# Patient Record
Sex: Male | Born: 1950 | Race: Black or African American | Hispanic: No | Marital: Single | Smoking: Former smoker
Health system: Southern US, Community
[De-identification: ages and names within clinical notes are randomized; demographics above are authoritative.]

## PROBLEM LIST (undated history)

## (undated) DIAGNOSIS — R030 Elevated blood-pressure reading, without diagnosis of hypertension: Secondary | ICD-10-CM

## (undated) DIAGNOSIS — R009 Unspecified abnormalities of heart beat: Secondary | ICD-10-CM

## (undated) DIAGNOSIS — Z87438 Personal history of other diseases of male genital organs: Secondary | ICD-10-CM

## (undated) HISTORY — PX: MANDIBLE FRACTURE SURGERY: SHX706

## (undated) HISTORY — DX: Elevated blood-pressure reading, without diagnosis of hypertension: R00.9

## (undated) HISTORY — DX: Elevated blood-pressure reading, without diagnosis of hypertension: R03.0

## (undated) HISTORY — DX: Personal history of other diseases of male genital organs: Z87.438

---

## 1999-03-05 ENCOUNTER — Emergency Department (HOSPITAL_COMMUNITY): Admission: EM | Admit: 1999-03-05 | Discharge: 1999-03-05 | Payer: Self-pay | Admitting: Emergency Medicine

## 1999-11-05 ENCOUNTER — Encounter: Payer: Self-pay | Admitting: Cardiology

## 1999-11-05 ENCOUNTER — Encounter: Admission: RE | Admit: 1999-11-05 | Discharge: 1999-11-05 | Payer: Self-pay | Admitting: Cardiology

## 1999-11-10 ENCOUNTER — Encounter: Admission: RE | Admit: 1999-11-10 | Discharge: 1999-11-10 | Payer: Self-pay | Admitting: Cardiology

## 1999-11-10 ENCOUNTER — Encounter: Payer: Self-pay | Admitting: Cardiology

## 2001-01-27 ENCOUNTER — Ambulatory Visit (HOSPITAL_COMMUNITY): Admission: RE | Admit: 2001-01-27 | Discharge: 2001-01-27 | Payer: Self-pay | Admitting: *Deleted

## 2006-02-14 ENCOUNTER — Encounter: Admission: RE | Admit: 2006-02-14 | Discharge: 2006-02-14 | Payer: Self-pay | Admitting: Cardiology

## 2008-10-21 ENCOUNTER — Encounter: Admission: RE | Admit: 2008-10-21 | Discharge: 2008-10-21 | Payer: Self-pay | Admitting: Cardiology

## 2008-10-21 IMAGING — CT CT HEAD W/O CM
2 series · 16 of 30 positions shown, 20 images · non-contrast
Comparison: None

CLINICAL DATA: Headache, neck stiffness

CT HEAD WITHOUT CONTRAST
TECHNIQUE: Contiguous axial images were obtained from the base of
the skull through the vertex without contrast.

[Series 2: head w/o · axial · non-contrast · 0.45mm/px · z∈[+35,+146]mm · 13 of 32 slices shown, 17 images]
[im 3/32  brain]
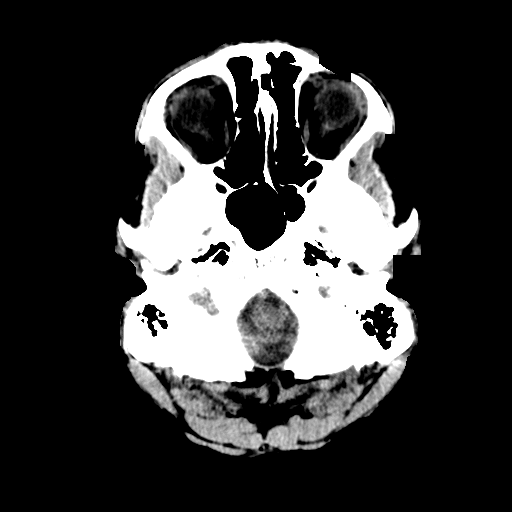
[im 3/32  bone]
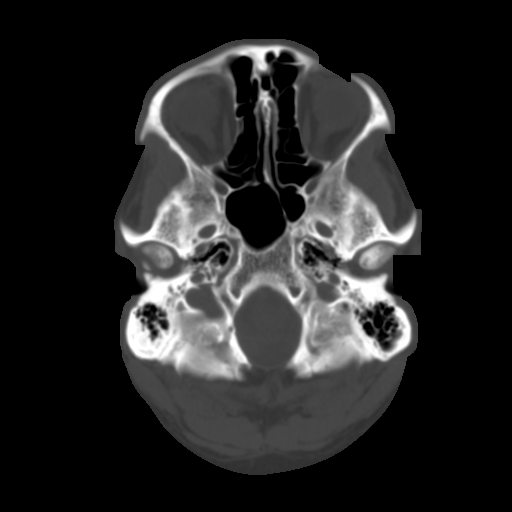
[im 5/32  brain]
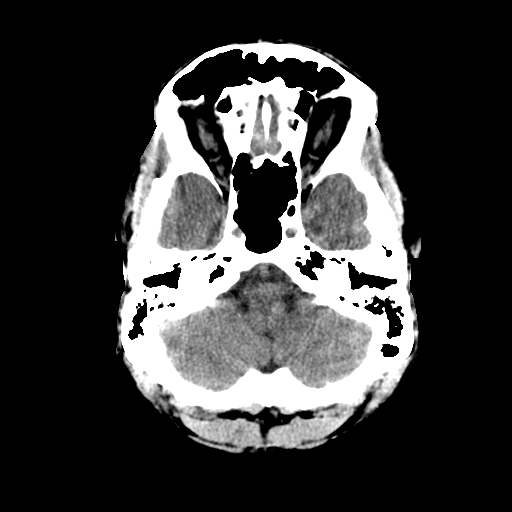
[im 7/32  brain]
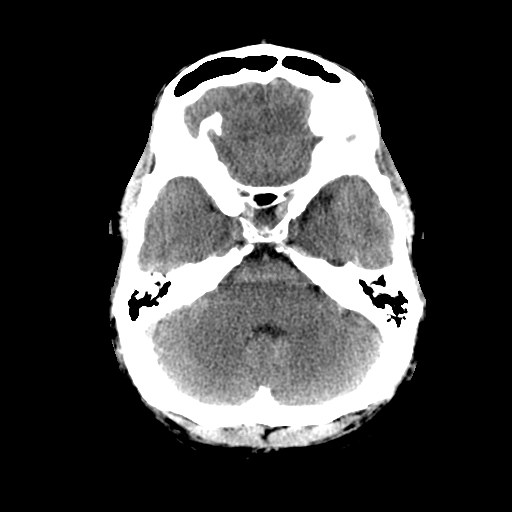
[im 9/32  brain]
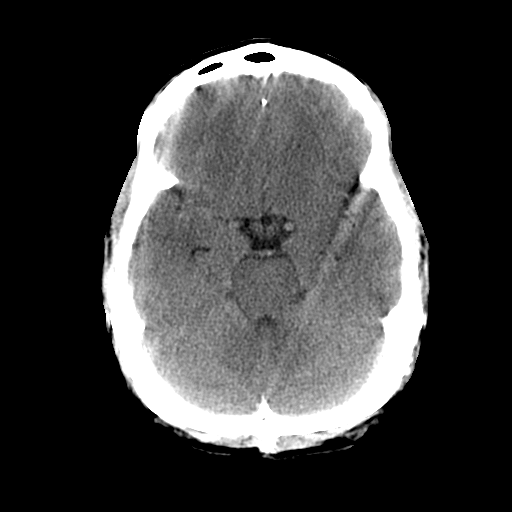
[im 12/32  brain]
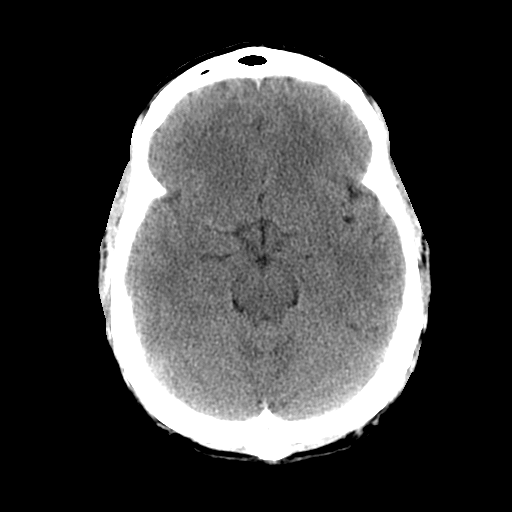
[im 12/32  bone]
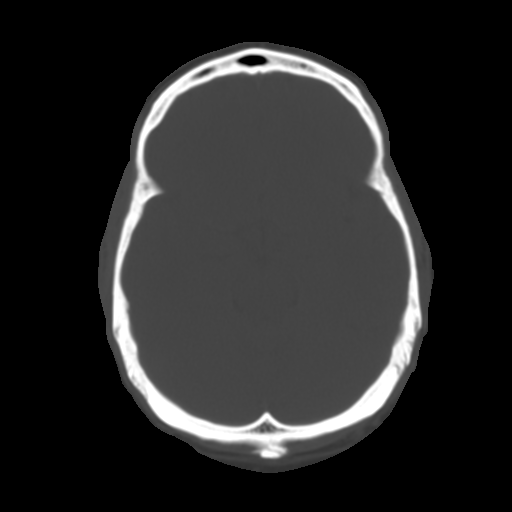
[im 14/32  brain]
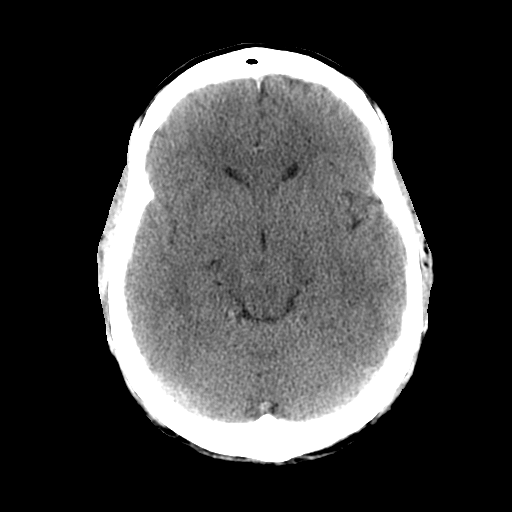
[im 16/32  brain]
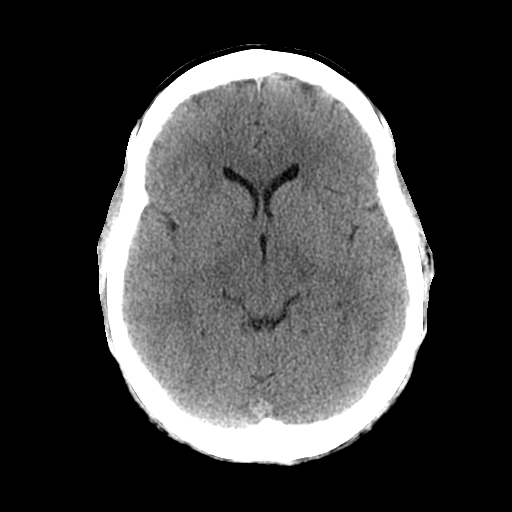
[im 18/32  brain]
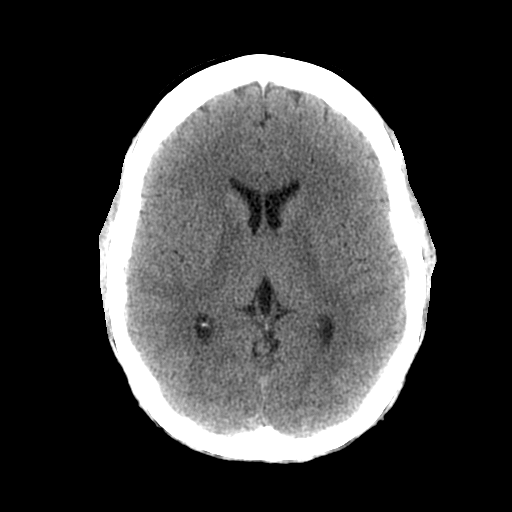
[im 20/32  brain]
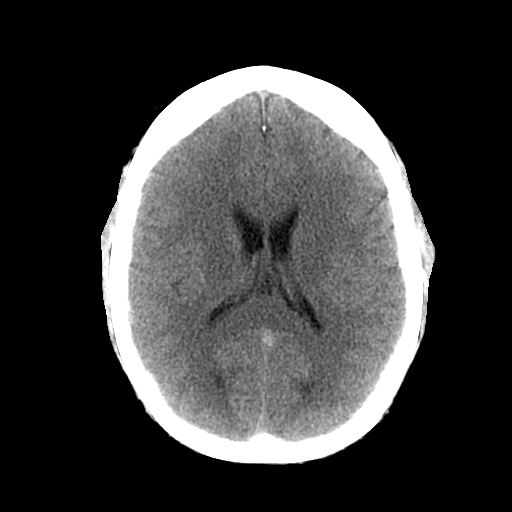
[im 20/32  bone]
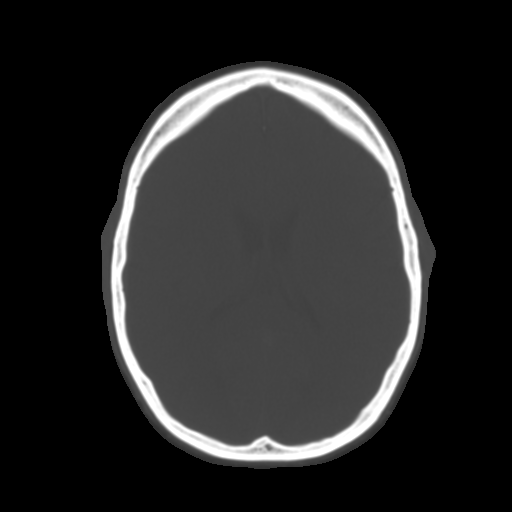
[im 23/32  brain]
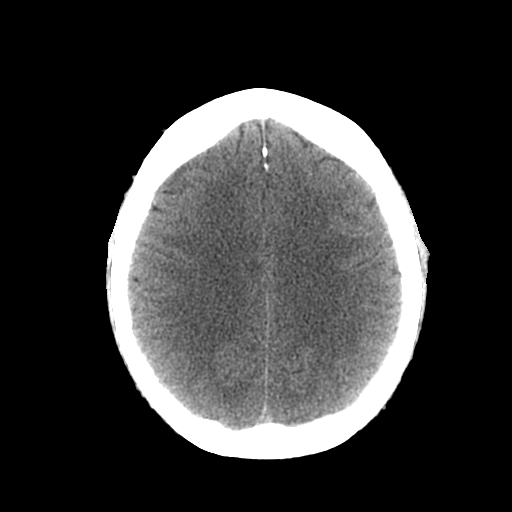
[im 25/32  brain]
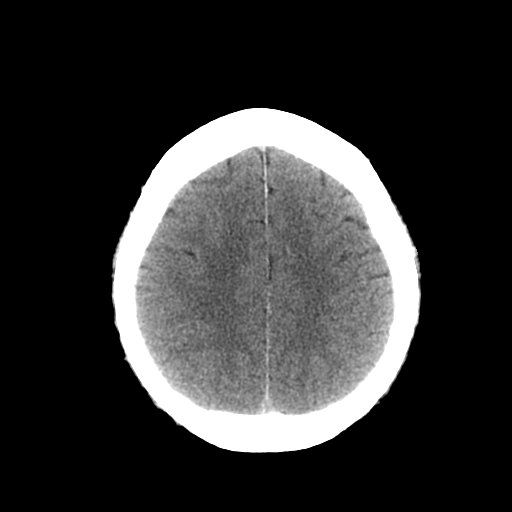
[im 27/32  brain]
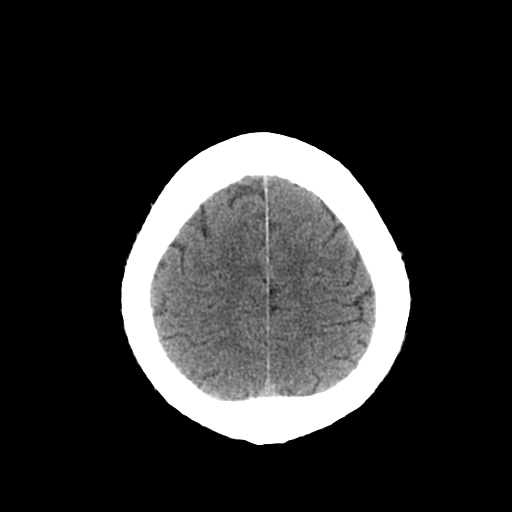
[im 29/32  brain]
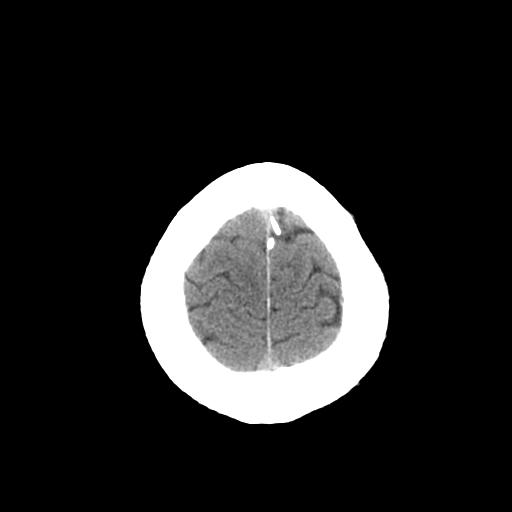
[im 29/32  bone]
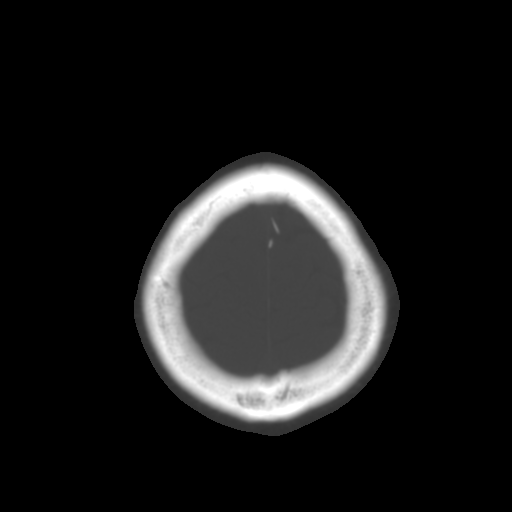

[Series 3: bone windows · axial · 0.45mm/px · z∈[+35,+70]mm · 3 of 32 slices shown]
[im 3/32  bone]
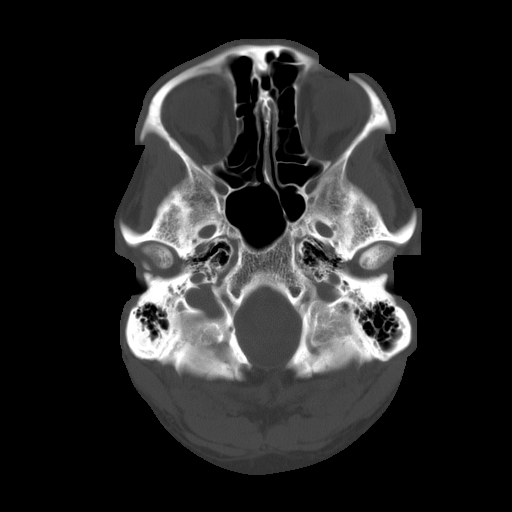
[im 7/32  bone]
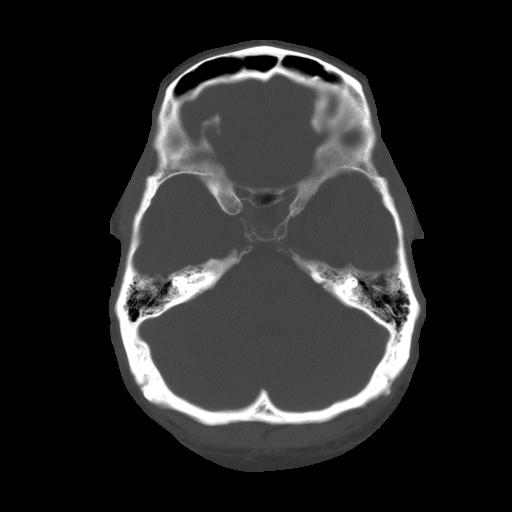
[im 12/32  bone]
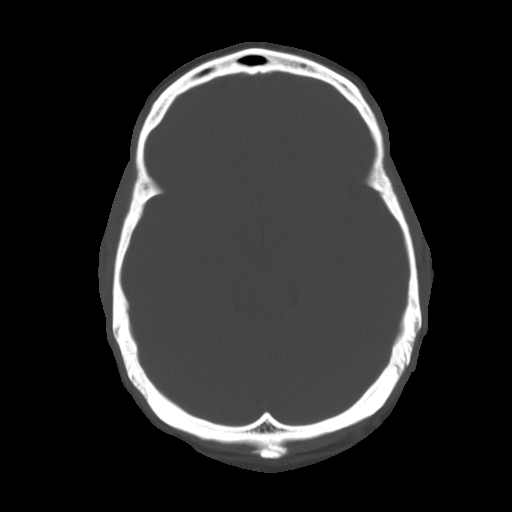

[16 of 30 positions shown; findings below may reference images not displayed]

FINDINGS: No acute hemorrhage, acute infarction, or mass lesion is
identified.  No midline shift.  No ventriculomegaly.  Orbits and
paranasal sinuses are intact.  No skull fracture.
IMPRESSION: No acute intracranial finding.

## 2010-01-09 ENCOUNTER — Emergency Department (HOSPITAL_COMMUNITY): Admission: EM | Admit: 2010-01-09 | Discharge: 2010-01-09 | Payer: Self-pay | Admitting: Emergency Medicine

## 2010-01-10 ENCOUNTER — Emergency Department (HOSPITAL_COMMUNITY): Admission: EM | Admit: 2010-01-10 | Discharge: 2010-01-10 | Payer: Self-pay | Admitting: Emergency Medicine

## 2010-07-31 LAB — POCT I-STAT, CHEM 8
BUN: 17 mg/dL (ref 6–23)
Calcium, Ion: 1.14 mmol/L (ref 1.12–1.32)
Chloride: 107 mEq/L (ref 96–112)
Creatinine, Ser: 1.2 mg/dL (ref 0.4–1.5)
Glucose, Bld: 81 mg/dL (ref 70–99)
HCT: 50 % (ref 39.0–52.0)
Hemoglobin: 17 g/dL (ref 13.0–17.0)
Potassium: 4 mEq/L (ref 3.5–5.1)
Sodium: 141 mEq/L (ref 135–145)
TCO2: 27 mmol/L (ref 0–100)

## 2011-06-21 ENCOUNTER — Other Ambulatory Visit: Payer: Self-pay | Admitting: Cardiology

## 2011-06-21 ENCOUNTER — Ambulatory Visit
Admission: RE | Admit: 2011-06-21 | Discharge: 2011-06-21 | Disposition: A | Discharge status: Home / Self Care | Payer: BC Managed Care – PPO | Source: Ambulatory Visit | Attending: Cardiology | Admitting: Cardiology

## 2011-06-21 DIAGNOSIS — R52 Pain, unspecified: Secondary | ICD-10-CM

## 2011-07-12 ENCOUNTER — Ambulatory Visit (HOSPITAL_BASED_OUTPATIENT_CLINIC_OR_DEPARTMENT_OTHER): Payer: BC Managed Care – PPO

## 2011-12-13 ENCOUNTER — Ambulatory Visit
Admission: RE | Admit: 2011-12-13 | Discharge: 2011-12-13 | Disposition: A | Discharge status: Home / Self Care | Payer: BC Managed Care – PPO | Source: Ambulatory Visit | Attending: Cardiology | Admitting: Cardiology

## 2011-12-13 ENCOUNTER — Other Ambulatory Visit: Payer: Self-pay | Admitting: Cardiology

## 2011-12-13 DIAGNOSIS — M25512 Pain in left shoulder: Secondary | ICD-10-CM

## 2012-07-07 ENCOUNTER — Emergency Department (HOSPITAL_COMMUNITY)
Admission: EM | Admit: 2012-07-07 | Discharge: 2012-07-07 | Disposition: A | Discharge status: Home / Self Care | Payer: BC Managed Care – PPO | Source: Home / Self Care | Attending: Emergency Medicine | Admitting: Emergency Medicine

## 2012-07-07 ENCOUNTER — Encounter (HOSPITAL_COMMUNITY): Payer: Self-pay | Admitting: Emergency Medicine

## 2012-07-07 MED ORDER — PREDNISONE 5 MG PO KIT: 1.0000 | PACK | Freq: Every day | ORAL | Status: DC

## 2012-07-07 MED ORDER — PERMETHRIN 5 % EX CREA: TOPICAL_CREAM | CUTANEOUS | Status: DC

## 2012-07-07 MED ORDER — HYDROXYZINE HCL 25 MG PO TABS: 25.0000 mg | ORAL_TABLET | Freq: Four times a day (QID) | ORAL | Status: DC

## 2012-07-07 NOTE — ED Provider Notes (Signed)
Chief Complaint  Patient presents with  . Rash    rash on hands arms stomach and back x 2 wks.     History of Present Illness:   Seth Cox is a 62 year old male who has had a two-week history of a pruritic rash on his hands, arms, chest, and back. It's worse at nighttime. He has not been around anyone else who has had a similar rash and he lives by himself. He cannot think of anything that he might have come in contact with including soaps, body wash, washing powders, detergents, dryer sheet, or fabric softener. No exposure to plants, animals, chemicals at home, chemicals at work, or cosmetics. No new foods or medications. He denies any difficulty breathing or wheezing. He does have some chapped lips and has been using Carmex, but doesn't think that this was the cause of it since the rash broke out before he started using the Carmex.  Review of Systems:  Other than noted above, the patient denies any of the following symptoms: Systemic:  No fever, chills, sweats, weight loss, or fatigue. ENT:  No nasal congestion, rhinorrhea, sore throat, swelling of lips, tongue or throat. Resp:  No cough, wheezing, or shortness of breath. Skin:  No rash, itching, nodules, or suspicious lesions.  PMFSH:  Past medical history, family history, social history, meds, and allergies were reviewed.  Physical Exam:   Vital signs:  BP 141/75  Pulse 60  Temp(Src) 98.1 F (36.7 C) (Oral)  Resp 18  SpO2 100% Gen:  Alert, oriented, in no distress. ENT:  Pharynx clear, no intraoral lesions, moist mucous membranes. Lungs:  Clear to auscultation. Skin:  He has a maculopapular rash on the web spaces between the fingers, a few lesions on the wrist, upper and lower arms, chest, and back.  Assessment:  The encounter diagnosis was Scabies.  Differential diagnosis includes scabies, contact dermatitis, or dyshidrotic eczema.  Plan:   1.  The following meds were prescribed:   New Prescriptions   HYDROXYZINE  (ATARAX/VISTARIL) 25 MG TABLET    Take 1 tablet (25 mg total) by mouth every 6 (six) hours.   PERMETHRIN (ELIMITE) 5 % CREAM    Apply head to toe at bedtime.  Leave on for 8 hours.  Scrub off next morning.  Repeat procedure in 1 week.   PREDNISONE 5 MG KIT    Take 1 kit (5 mg total) by mouth daily after breakfast. Prednisone 5 mg 6 day dosepack.  Take as directed.   2.  The patient was instructed in symptomatic care and handouts were given. 3.  The patient was told to return if becoming worse in any way, if no better in 3 or 4 days, and given some red flag symptoms that would indicate earlier return.     Reuben Likes, MD 07/07/12 [phone]

## 2012-07-07 NOTE — ED Notes (Signed)
Pt c/o rash on hands, stomach, arms and back x 2 wks and is gradually getting worse. Pt denies changes in soaps or detergents. Pt has not tried otc meds for treatment,.

## 2012-08-27 ENCOUNTER — Encounter (HOSPITAL_COMMUNITY): Payer: Self-pay | Admitting: *Deleted

## 2012-08-27 ENCOUNTER — Emergency Department (HOSPITAL_COMMUNITY)
Admission: EM | Admit: 2012-08-27 | Discharge: 2012-08-27 | Disposition: A | Discharge status: Home / Self Care | Payer: BC Managed Care – PPO | Source: Home / Self Care | Attending: Emergency Medicine | Admitting: Emergency Medicine

## 2012-08-27 DIAGNOSIS — R21 Rash and other nonspecific skin eruption: Secondary | ICD-10-CM

## 2012-08-27 DIAGNOSIS — L819 Disorder of pigmentation, unspecified: Secondary | ICD-10-CM

## 2012-08-27 MED ORDER — AQUAPHOR EX OINT: TOPICAL_OINTMENT | CUTANEOUS | Status: DC | PRN
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
## 2012-08-27 NOTE — ED Notes (Signed)
Patient left before discharge papers were signed and given. Was informed by front desk that patient was very upset.
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
## 2014-12-11 ENCOUNTER — Ambulatory Visit: Payer: 59 | Attending: Orthopaedic Surgery | Admitting: Physical Therapy

## 2014-12-11 DIAGNOSIS — R293 Abnormal posture: Secondary | ICD-10-CM

## 2014-12-11 DIAGNOSIS — M545 Low back pain, unspecified: Secondary | ICD-10-CM

## 2014-12-11 DIAGNOSIS — M62830 Muscle spasm of back: Secondary | ICD-10-CM | POA: Insufficient documentation

## 2014-12-11 DIAGNOSIS — M542 Cervicalgia: Secondary | ICD-10-CM | POA: Insufficient documentation

## 2014-12-11 NOTE — Patient Instructions (Signed)
   Layali Freund PT, DPT, LAT, ATC  [facility] Phone: [phone]     

## 2014-12-11 NOTE — Therapy (Addendum)
[place], [place], [address] Phone: [phone]   Fax:  [phone]  Physical Therapy Evaluation  Patient Details  Name: Seth Cox MRN: [id number] Date of Birth: [birth year] Referring Provider:  Garald Balding, MD  Encounter Date: 12/11/2014      PT End of Session - 12/11/14 1201    Visit Number 1   Number of Visits 1   Date for PT Re-Evaluation 12/12/14   PT Start Time 1100   PT Stop Time 1145   PT Time Calculation (min) 45 min   Activity Tolerance Patient tolerated treatment well   Behavior During Therapy [facility] for tasks assessed/performed      No past medical history on file.  No past surgical history on file.  There were no vitals filed for this visit.  Visit Diagnosis:  Left-sided low back pain without sciatica - Plan: PT plan of care cert/re-cert  Neck pain - Plan: PT plan of care cert/re-cert  Muscle spasm of back - Plan: PT plan of care cert/re-cert  Abnormal posture - Plan: PT plan of care cert/re-cert      Subjective Assessment - 12/11/14 1108    Subjective pt is a 64 y.o M with CC of neck with referral to the left shoulder and low back pain with referral down to the mid lateral L shin  that started In January that started insidiously that has progressively gotten worse since onset.    Limitations House hold activities;Walking;Standing   How long can you sit comfortably? unlimited   How long can you stand comfortably? unlimited   How long can you walk comfortably? 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 Yes   Pain Score 2    Pain Location Back   Pain Orientation Left   Pain Descriptors / Indicators Shooting   Pain Type Chronic pain   Pain Radiating Towards down to the L lateral shin   Pain Frequency Intermittent   Aggravating Factors  standing after prolonged  sitting,    Pain Relieving Factors walking, movement   Multiple Pain Sites Yes   Pain Score 1   Pain Location Neck   Pain Orientation Left   Pain Descriptors / Indicators Tightness;Shooting   Pain Type Chronic pain   Pain Radiating Towards to the left shoulder   Pain Onset More than a month ago   Pain Frequency Intermittent   Aggravating Factors  laying on it,    Pain Relieving Factors getting off of the shoulder            [facility] PT Assessment - 12/11/14 1109    Assessment   Medical Diagnosis neck and low back apin   Onset Date/Surgical Date --  january of 2016   Hand Dominance Right   Next MD Visit PRN   Prior Therapy Yes   Precautions   Precautions None   Restrictions   Weight Bearing Restrictions No   Balance Screen   Has the patient fallen in the past 6 months No   Has the patient had a decrease in activity level because of a fear of falling?  No   Is the patient reluctant to leave their home because of a fear of falling?  No   Home Environment   Living Environment Private residence   Living Arrangements Alone   Type  of [place] to enter   Entrance Stairs-Number of Steps 3   Entrance Stairs-Rails None   Home Layout One level   Home Equipment None   Prior Function   Level of Independence Independent   Vocation Full time employment  truck driver    Vocation Requirements prolonged sitting, lifting, carrying, lifting,   Leisure golf   Cognition   Overall Cognitive Status Within Functional Limits for tasks assessed   Observation/Other Assessments   Focus on Therapeutic Outcomes (FOTO)  24% limitation   predicted 24%    Posture/Postural Control   Posture/Postural Control Postural limitations   Postural Limitations Decreased lumbar lordosis;Forward head;Rounded Shoulders;Posterior pelvic tilt   ROM / Strength   AROM / PROM / Strength AROM;Strength   AROM   AROM Assessment Site Cervical;Lumbar   Cervical Flexion 42   Cervical Extension  55   Cervical - Right Side Bend 38   Cervical - Left Side Bend 28  pain at the end range and referral to the shoulder   Cervical - Right Rotation 45   Cervical - Left Rotation 45   Lumbar Flexion 82   Lumbar Extension 18   Lumbar - Right Side Bend 55   Lumbar - Left Side Bend 55   Lumbar - Right Rotation 75%   Lumbar - Left Rotation 75%   Strength   Strength Assessment Site Hip;Knee   Right/Left Hip Right;Left   Right Hip Flexion 5/5   Right Hip Extension 5/5   Right Hip ABduction 4+/5   Right Hip ADduction 4/5   Left Hip Flexion 4+/5   Left Hip Extension 4/5   Left Hip ABduction 4/5   Left Hip ADduction 4/5   Right/Left Knee Right;Left   Right Knee Flexion 5/5   Right Knee Extension 5/5   Left Knee Flexion 5/5   Left Knee Extension 5/5   Palpation   Spinal mobility tenderness noted at L1-L2 with burning sensation upon palpation, with tenderness at the piriformis.   cervical spine tenderness at L upper trap/rhomboids   Special Tests    Special Tests Lumbar;Cervical   Cervical Tests Spurling's;other   Lumbar Tests Straight Leg Raise;Prone Knee Bend Test   Spurling's   Findings Negative   other    Findings Negative   Comment ULLT   Prone Knee Bend Test   Findings Negative   Side --  bil   Straight Leg Raise   Findings Negative   Side  --  bil   Ambulation/Gait   Gait Pattern Step-through pattern;Within Functional Limits                           PT Education - 12/11/14 1200    Education provided Yes   Education Details evaluation findings, POC, goals, HEP   Person(s) Educated Patient   Methods Explanation   Comprehension Verbalized understanding                    Plan - 12/11/14 1201    Clinical Impression Statement Seth Cox presents to OPPT with CC of neck pain with referral of symptoms to the left shoulder and low back pain with referral of symptoms to the left hip and intemrittent referral to the L lateral shin. he  demontrates functional trunk mobility with pain during flexion in his hamstrings due to tightness. MMT revealed mild weakness in the L hip musculature compared bil. plapation revealed hypomobility and tenderness  at L1-L2  with no referral of symptoms down the legs. palaption at the neck revealed tendernes of the Left upper trap and rhomboid musculature. sitting he demosntrated forward head posture, anteriorly rolled shoulders and decresaed lumbar lordosis. pt reported that he would only like to be seen for this visit only and return back to the doctor after he has done his exercises and see what progress he has made.    Pt will benefit from skilled therapeutic intervention in order to improve on the following deficits Pain;Hypomobility;Decreased activity tolerance;Decreased endurance;Increased muscle spasms   Rehab Potential Good   PT Frequency --  1 visit only   PT Treatment/Interventions ADLs/Self Care Home Management;Cryotherapy;Electrical Stimulation;Moist Heat;Ultrasound;Therapeutic activities;Therapeutic exercise;Neuromuscular re-education;Patient/family education;Balance training;Dry needling;Taping   PT Next Visit Plan pt opted for only evaluation visit   PT Home Exercise Plan see HEP handout   Consulted and Agree with Plan of Care Patient         Problem List There are no active problems to display for this patient.  Starr Lake PT, DPT, LAT, ATC  12/11/2014  12:29 PM      [facility] [address] [place], [place], [address] Phone: [phone]   Fax:  [phone]        PHYSICAL THERAPY DISCHARGE SUMMARY  Visits from Start of Care: 1  Current functional level related to goals / functional outcomes: FOTO 24% limited   Remaining deficits: N/A   Education / Equipment: HEP  Plan: Patient agrees to discharge.  Patient goals were not met. Patient is being discharged due to                                                      ????? 1 x visit only          Jyrah Blye PT, DPT, LAT, ATC  03/03/2015  8:40 AM

## 2017-07-07 ENCOUNTER — Telehealth: Payer: Self-pay | Admitting: Cardiology

## 2017-07-07 NOTE — Telephone Encounter (Signed)
Received incoming records from [facility] for upcoming appointment on 07/11/17 @ 8:20 am with Dr. Herbie BaltimoreHarding. Records located in Medical Records. 07/07/17 ab

## 2017-07-08 DIAGNOSIS — Z87438 Personal history of other diseases of male genital organs: Secondary | ICD-10-CM | POA: Insufficient documentation

## 2017-07-11 ENCOUNTER — Ambulatory Visit (INDEPENDENT_AMBULATORY_CARE_PROVIDER_SITE_OTHER): Payer: 59 | Admitting: Cardiology

## 2017-07-11 ENCOUNTER — Encounter: Payer: Self-pay | Admitting: Cardiology

## 2017-07-11 VITALS — BP 134/72 | HR 49 | Ht 72.0 in | Wt 210.4 lb

## 2017-07-11 DIAGNOSIS — R001 Bradycardia, unspecified: Secondary | ICD-10-CM | POA: Insufficient documentation

## 2017-07-11 DIAGNOSIS — R079 Chest pain, unspecified: Secondary | ICD-10-CM | POA: Diagnosis not present

## 2017-07-11 DIAGNOSIS — R009 Unspecified abnormalities of heart beat: Secondary | ICD-10-CM | POA: Diagnosis not present

## 2017-07-11 DIAGNOSIS — R030 Elevated blood-pressure reading, without diagnosis of hypertension: Secondary | ICD-10-CM | POA: Diagnosis not present

## 2017-07-11 NOTE — Assessment & Plan Note (Signed)
Blood pressures well controlled today.  Not currently on medications.  Obviously cannot use beta-blocker.

## 2017-07-11 NOTE — Progress Notes (Signed)
PCP: Patient, No Pcp Per  Clinic Note: Chief Complaint  Patient presents with  . New pt eval    Referred by Dr. Nehemiah SettlePolite for Atypical CP  pt c/o occasional sharp chest pain accompanied by occasional SOB; dizziness--when changing positions; no other Sx.    HPI: Seth Cox is a 67 y.o. male who is being seen today for the evaluation of Atypical Chest Pain at the request of Renford DillsPolite, Ronald, MD. His only with the past medical history is BPH as well as elevated blood pressure without diagnosis of hypertension.  He is a 1 pack/day smoker-->   Quit Sept 2018 with the aid of Zyban.  He also does drink some alcohol but not to excess.  Seth Cox was seen on June 30, 2017 chest pain by Dr. Nehemiah SettlePolite c/o chest pain.  Was referred for Cardiology findings.  Recent Hospitalizations: None  Studies Personally Reviewed - (if available, images/films reviewed: From Epic Chart or Care Everywhere)  None  Interval History: Seth Cox presents here today with complaints of intermittent episodes of central/substernal chest discomfort that happens off and on not necessarily associated with any particular activity.  It is described as a sharp pressure-like discomfort and episodes may last anywhere from 2-5 minutes.  Sometimes he feels a little lightheaded and heart skipping when these episodes occur, and often they are associated with dyspnea.  The discomfort can occur both at rest and with exertion, and oftentimes occurs when he lies down at night.  Remainder of his cardiac review of symptoms is relatively normal: No PND, orthopnea or edema.  No weakness or syncope/near syncope, or TIA/amaurosis fugax symptoms. No melena, hematochezia, hematuria, or epstaxis. No claudication.  ROS: A comprehensive was performed. Review of Systems  Constitutional: Positive for malaise/fatigue (He does usually get tired about midway through the day.).  HENT: Negative for congestion and nosebleeds.   Respiratory:  Positive for shortness of breath (Off and on, often associated with the worst part of chest pain episodes).   Cardiovascular: Negative for claudication.  Gastrointestinal: Negative for blood in stool, heartburn and melena.  Genitourinary: Negative for hematuria.  Musculoskeletal: Positive for joint pain (Mild arthritis pains.).  Neurological: Positive for dizziness.       Lightheadedness with mild dizziness occasionally.  Usually associated with chest discomfort  Psychiatric/Behavioral: Negative for memory loss. The patient is not nervous/anxious and does not have insomnia.   All other systems reviewed and are negative.  PAD Screen 07/11/2017  Previous PAD dx? No  Previous surgical procedure? No  Pain with walking? No  Feet/toe relief with dangling? No  Painful, non-healing ulcers? No  Extremities discolored? No   I have reviewed and (if needed) personally updated the patient's problem list, medications, allergies, past medical and surgical history, social and family history.   Past Medical History:  Diagnosis Date  . Elevated heart rate with elevated blood pressure without diagnosis of hypertension   . History of BPH     Past Surgical History:  Procedure Laterality Date  . MANDIBLE FRACTURE SURGERY      Current Meds  Medication Sig  . celecoxib (CELEBREX) 200 MG capsule Take 1 capsule by mouth daily.  Marland Kitchen. oxybutynin (DITROPAN-XL) 10 MG 24 hr tablet Take 10 mg by mouth daily.  . tamsulosin (FLOMAX) 0.4 MG CAPS capsule Take 1 capsule by mouth at bedtime.    Allergies  Allergen Reactions  . Ibuprofen     Social History   Tobacco Use  . Smoking status: Former  Smoker    Packs/day: 1.00    Types: Cigarettes  . Smokeless tobacco: Never Used  Substance Use Topics  . Alcohol use: Yes    Comment: occasional  . Drug use: No   Social History   Social History Narrative   He is currently single, but clearly has a significant other who is with him today.  He lives alone.  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   He quit smoking in September 2018.  He may be has 4 drinks of vodka in the course of the week.  He does not drink when he is on the road driving.    family history includes Fibromyalgia in his mother; Healthy in his sister; Heart failure in his sister; Hypertension in his brother.  Wt Readings from Last 3 Encounters:  07/11/17 210 lb 6.4 oz (95.4 kg)    PHYSICAL EXAM BP 134/72   Pulse (!) 49   Ht 6' (1.829 m)   Wt 210 lb 6.4 oz (95.4 kg)   BMI 28.54 kg/m  Physical Exam  Constitutional: He is oriented to person, place, and time. He appears well-developed and well-nourished. No distress.  HENT:  Head: Normocephalic and atraumatic.  Mouth/Throat: No oropharyngeal exudate.  Eyes: Conjunctivae and EOM are normal. Pupils are equal, round, and reactive to light. No scleral icterus.  Neck: Normal range of motion. Neck supple. No hepatojugular reflux and no JVD present. Carotid bruit is not present. No thyromegaly present.  Cardiovascular: Regular rhythm and intact distal pulses.  No extrasystoles are present. Bradycardia present. PMI is not displaced. Exam reveals no gallop and no friction rub.  No murmur heard. Pulmonary/Chest: Effort normal and breath sounds normal. No respiratory distress. He has no wheezes. He exhibits no tenderness (Not able to reproduce pain on palpation.).  Abdominal: Soft. Bowel sounds are normal. He exhibits no distension. There is no tenderness. There is no rebound.  Musculoskeletal: Normal range of motion. He exhibits no edema or deformity.  Neurological: He is alert and oriented to person, place, and time. No cranial nerve deficit.  Skin: Skin is warm and dry.  Psychiatric: He has a normal mood and affect. His behavior is normal. Judgment and thought content normal.  Nursing note and vitals reviewed.    Adult ECG Report EKG from PCP: 06/30/2017  Rate: 45;  Rhythm: sinus bradycardia and Normal axis,  intervals and durations.;   Narrative Interpretation: Besides marked sinus bradycardia, normal EKG  Rate: 49 ;  Rhythm: sinus bradycardia, premature ventricular contractions (PVC) and Otherwise normal axis, intervals and durations.;   Narrative Interpretation: Otherwise normal EKG.  No notable change.   Other studies Reviewed: Additional studies/ records that were reviewed today include:  Recent Labs: CBC with WBC 4.7 H/H 14.1/42.6.  Platelets 197   ASSESSMENT / PLAN: Problem List Items Addressed This Visit    Chest pain with moderate risk for cardiac etiology    Chest pain that may or may 67-year.  He has borderline blood pressures but no other cardiac risk factors.  However we are not aware of his lipids. He is not sure that he would be able to fully walk on treadmill because of muscular skeletal pains, but I would like to see what his heart Rate responsiveness to exercise would be.    Plan: Treadmill Myoview stress test      Relevant Orders   EKG 12-Lead   MYOCARDIAL PERFUSION IMAGING   Elevated heart rate with elevated blood  pressure without diagnosis of hypertension (Chronic)    Blood pressures well controlled today.  Not currently on medications.  Obviously cannot use beta-blocker.      Sinus bradycardia (Chronic)    Resting sinus bradycardia, not on any medications that would cause this.  I would like to see his heart rate response to exercise on a treadmill. He does have some fatigue at the end of the day and this may be associated with his cardia.  Need to see if there is any signs of chronotropic incompetence on Treadmill Myoview, provided he can exercise.         Current medicines are reviewed at length with the patient today. (+/- concerns) none The following changes have been made:None  Patient Instructions  NO MEDICATION CHANGES   TEST SCHEDULE AT [address] Your physician has requested that you have en exercise stress myoview. For  further information please visit [email or web address] Please follow instruction sheet, as given.   Your physician recommends that you schedule a follow-up appointment in 1 MONTH WITH DR Cederick Broadnax.   If you need a refill on your cardiac medications before your next appointment, please call your pharmacy.  Studies Ordered:   Orders Placed This Encounter  Procedures  . MYOCARDIAL PERFUSION IMAGING  . EKG 12-Lead      Bryan Lemma, M.D., M.S. Interventional Cardiologist   Pager # [phone] Phone # [phone] [address] [place], [place] [address]   Thank you for choosing [facility]!!

## 2017-07-11 NOTE — Assessment & Plan Note (Signed)
Chest pain that may or may 67-year.  He has borderline blood pressures but no other cardiac risk factors.  However we are not aware of his lipids. He is not sure that he would be able to fully walk on treadmill because of muscular skeletal pains, but I would like to see what his heart Rate responsiveness to exercise would be.    Plan: Treadmill Myoview stress test

## 2017-07-11 NOTE — Patient Instructions (Addendum)
NO MEDICATION CHANGES   TEST SCHEDULE AT [address] Your physician has requested that you have en exercise stress myoview. For further information please visit [email or web address] Please follow instruction sheet, as given.      Your physician recommends that you schedule a follow-up appointment in 1 MONTH WITH DR HARDING.   If you need a refill on your cardiac medications before your next appointment, please call your pharmacy.    Cardiac Nuclear Scan A cardiac nuclear scan is a test that measures blood flow to the heart when a person is resting and when he or she is exercising. The test looks for problems such as:  Not enough blood reaching a portion of the heart.  The heart muscle not working normally.  You may need this test if:  You have heart disease.  You have had abnormal lab results.  You have had heart surgery or angioplasty.  You have chest pain.  You have shortness of breath.  In this test, a radioactive dye (tracer) is injected into your bloodstream. After the tracer has traveled to your heart, an imaging device is used to measure how much of the tracer is absorbed by or distributed to various areas of your heart. This procedure is usually done at a hospital and takes 2-4 hours. Tell a health care provider about:  Any allergies you have.  All medicines you are taking, including vitamins, herbs, eye drops, creams, and over-the-counter medicines.  Any problems you or family members have had with the use of anesthetic medicines.  Any blood disorders you have.  Any surgeries you have had.  Any medical conditions you have.  Whether you are pregnant or may be pregnant. What are the risks? Generally, this is a safe procedure. However, problems may occur, including:  Serious chest pain and heart attack. This is only a risk if the stress portion of the test is done.  Rapid heartbeat.  Sensation of warmth in your chest. This usually  passes quickly.  What happens before the procedure?  Ask your health care provider about changing or stopping your regular medicines. This is especially important if you are taking diabetes medicines or blood thinners.  Remove your jewelry on the day of the procedure. What happens during the procedure?  An IV tube will be inserted into one of your veins.  Your health care provider will inject a small amount of radioactive tracer through the tube.  You will wait for 20-40 minutes while the tracer travels through your bloodstream.  Your heart activity will be monitored with an electrocardiogram (ECG).  You will lie down on an exam table.  Images of your heart will be taken for about 15-20 minutes.  You may be asked to exercise on a treadmill or stationary bike. While you exercise, your heart's activity will be monitored with an ECG, and your blood pressure will be checked. If you are unable to exercise, you may be given a medicine to increase blood flow to parts of your heart.  When blood flow to your heart has peaked, a tracer will again be injected through the IV tube.  After 20-40 minutes, you will get back on the exam table and have more images taken of your heart.  When the procedure is over, your IV tube will be removed. The procedure may vary among health care providers and hospitals. Depending on the type of tracer used, scans may need to be repeated 3-4 hours later. What happens after the procedure?  Unless your health care provider tells you otherwise, you may return to your normal schedule, including diet, activities, and medicines.  Unless your health care provider tells you otherwise, you may increase your fluid intake. This will help flush the contrast dye from your body. Drink enough fluid to keep your urine clear or pale yellow.  It is up to you to get your test results. Ask your health care provider, or the department that is doing the test, when your results will be  ready. Summary  A cardiac nuclear scan measures the blood flow to the heart when a person is resting and when he or she is exercising.  You may need this test if you are at risk for heart disease.  Tell your health care provider if you are pregnant.  Unless your health care provider tells you otherwise, increase your fluid intake. This will help flush the contrast dye from your body. Drink enough fluid to keep your urine clear or pale yellow. This information is not intended to replace advice given to you by your health care provider. Make sure you discuss any questions you have with your health care provider. Document Released: 05/28/2004 Document Revised: 05/05/2016 Document Reviewed: 04/11/2013 Elsevier Interactive Patient Education  2017 [employer]

## 2017-07-11 NOTE — Assessment & Plan Note (Signed)
Resting sinus bradycardia, not on any medications that would cause this.  I would like to see his heart rate response to exercise on a treadmill. He does have some fatigue at the end of the day and this may be associated with his cardia.  Need to see if there is any signs of chronotropic incompetence on Treadmill Myoview, provided he can exercise.

## 2017-07-20 ENCOUNTER — Telehealth (HOSPITAL_COMMUNITY): Payer: Self-pay

## 2017-07-20 NOTE — Telephone Encounter (Signed)
Encounter complete. 

## 2017-07-22 ENCOUNTER — Ambulatory Visit (HOSPITAL_COMMUNITY)
Admission: RE | Admit: 2017-07-22 | Discharge: 2017-07-22 | Disposition: A | Discharge status: Home / Self Care | Payer: 59 | Source: Ambulatory Visit | Attending: Cardiology | Admitting: Cardiology

## 2017-07-22 DIAGNOSIS — R079 Chest pain, unspecified: Secondary | ICD-10-CM | POA: Diagnosis not present

## 2017-07-22 LAB — MYOCARDIAL PERFUSION IMAGING
Estimated workload: 9.2 METS
Exercise duration (min): 9 min
Exercise duration (sec): 0 s
LVDIAVOL: 164 mL (ref 62–150)
LVSYSVOL: 89 mL
MPHR: 153 {beats}/min
NUC STRESS TID: 1.03
Peak HR: 139 {beats}/min
Percent HR: 90 %
RPE: 17
Rest HR: 44 {beats}/min
SDS: 1
SRS: 2
SSS: 3

## 2017-07-22 MED ORDER — TECHNETIUM TC 99M TETROFOSMIN IV KIT
32.1000 | PACK | Freq: Once | INTRAVENOUS | Status: AC | PRN
  Administered 2017-07-22: 32.1 via INTRAVENOUS
  Filled 2017-07-22: qty 33

## 2017-07-22 MED ORDER — TECHNETIUM TC 99M TETROFOSMIN IV KIT
10.5000 | PACK | Freq: Once | INTRAVENOUS | Status: AC | PRN
  Administered 2017-07-22: 10.5 via INTRAVENOUS
  Filled 2017-07-22: qty 11

## 2017-08-01 ENCOUNTER — Telehealth: Payer: Self-pay | Admitting: *Deleted

## 2017-08-01 DIAGNOSIS — R079 Chest pain, unspecified: Secondary | ICD-10-CM

## 2017-08-01 DIAGNOSIS — R9439 Abnormal result of other cardiovascular function study: Secondary | ICD-10-CM

## 2017-08-01 NOTE — Telephone Encounter (Signed)
Spoke to patient. Spoke to patient. Result given . Verbalized understanding Patient states he is out of town a a great deal would like  appt for echo to be this  Late friday or Monday early.  appointment schedule for 4 pm -- Instruction left on voicemail per patient request

## 2017-08-01 NOTE — Telephone Encounter (Signed)
Follow up    Patient returning call to nurse Patient declined to schedule echo at this time

## 2017-08-01 NOTE — Telephone Encounter (Signed)
LEFT MESSAGE TO CALL BACK - STRESS TEST   NEED ECHO

## 2017-08-01 NOTE — Telephone Encounter (Signed)
-----   Message from Marykay Lexavid W Harding, MD sent at 07/27/2017  3:02 PM EDT ----- Nuclear stress test results: Pretty good exercise tolerance.  The stress test is read as intermediate risk because the pump function is reduced, and there is a suggestion of possible old heart attack.  Unfortunately, sometimes in the studies the pump function assessment is not accurate if there is concern for possible prior scar. Would recommend 2D echo to get a better assessment of ejection fraction and any possible wall motion normalities.  If there is indeed a wall motion normality noted, we can discuss with the options are going forward.  With an intermediate risk study and there being evidence of a defect, if he is having symptoms of chest discomfort that are concerning, we may need to consider heart catheterization for definitive evaluation.  Bryan Lemmaavid Harding, MD

## 2017-08-05 ENCOUNTER — Other Ambulatory Visit (HOSPITAL_COMMUNITY): Payer: 59

## 2017-08-12 ENCOUNTER — Other Ambulatory Visit: Payer: Self-pay

## 2017-08-12 ENCOUNTER — Ambulatory Visit (HOSPITAL_COMMUNITY): Payer: 59 | Attending: Cardiovascular Disease

## 2017-08-12 DIAGNOSIS — I371 Nonrheumatic pulmonary valve insufficiency: Secondary | ICD-10-CM | POA: Insufficient documentation

## 2017-08-12 DIAGNOSIS — R0600 Dyspnea, unspecified: Secondary | ICD-10-CM | POA: Diagnosis not present

## 2017-08-12 DIAGNOSIS — R001 Bradycardia, unspecified: Secondary | ICD-10-CM | POA: Diagnosis not present

## 2017-08-12 DIAGNOSIS — R079 Chest pain, unspecified: Secondary | ICD-10-CM | POA: Diagnosis not present

## 2017-08-12 DIAGNOSIS — R42 Dizziness and giddiness: Secondary | ICD-10-CM | POA: Insufficient documentation

## 2017-08-12 DIAGNOSIS — Z8249 Family history of ischemic heart disease and other diseases of the circulatory system: Secondary | ICD-10-CM | POA: Insufficient documentation

## 2017-08-12 DIAGNOSIS — Z87891 Personal history of nicotine dependence: Secondary | ICD-10-CM | POA: Diagnosis not present

## 2017-08-12 DIAGNOSIS — R9439 Abnormal result of other cardiovascular function study: Secondary | ICD-10-CM | POA: Insufficient documentation

## 2017-08-12 DIAGNOSIS — I517 Cardiomegaly: Secondary | ICD-10-CM | POA: Insufficient documentation

## 2017-08-15 ENCOUNTER — Ambulatory Visit (INDEPENDENT_AMBULATORY_CARE_PROVIDER_SITE_OTHER): Payer: 59 | Admitting: Cardiology

## 2017-08-15 ENCOUNTER — Encounter: Payer: Self-pay | Admitting: Cardiology

## 2017-08-15 VITALS — BP 151/85 | HR 56 | Ht 72.0 in | Wt 215.4 lb

## 2017-08-15 DIAGNOSIS — R001 Bradycardia, unspecified: Secondary | ICD-10-CM | POA: Diagnosis not present

## 2017-08-15 DIAGNOSIS — R030 Elevated blood-pressure reading, without diagnosis of hypertension: Secondary | ICD-10-CM

## 2017-08-15 DIAGNOSIS — R079 Chest pain, unspecified: Secondary | ICD-10-CM | POA: Diagnosis not present

## 2017-08-15 NOTE — Assessment & Plan Note (Signed)
Blood pressure still high day.  This is a new finding for him.  He is not on any medications.  Mild LVH with grade 1 diastolic dysfunction on echo would suggest that he may have had hypertension ongoing for a while.  Defer to PCP, but would avoid AV nodal agents such as Non-dihydropyridine calcium channel blockers (diltiazem/verapamil) or beta-blockers.

## 2017-08-15 NOTE — Assessment & Plan Note (Signed)
Relatively concerning symptoms on initial evaluation, no longer having pain.  This along with a negative Myoview for ischemia would argue against active CAD. I suspect that the Myoview with the soft tissue attenuation lead to a false reading on the EF being somewhat decreased with inferior wall motion normality.  We checked this out with an echocardiogram which revealed no wall motion abnormality and normal pump function.  Low likelihood of being cardiac in nature.  Suspect probably GI or musculoskeletal symptoms.

## 2017-08-15 NOTE — Patient Instructions (Addendum)
Medication Instructions:  No medication changes  Follow-Up: Your physician recommends that you schedule a follow-up appointment as needed

## 2017-08-15 NOTE — Assessment & Plan Note (Signed)
Resting heart rate in the 50s on no medications. Excellent heart rate responsiveness on treadmill with no signs of chronotropic incompetence.  Would simply avoid AV nodal agents for treating blood pressure.

## 2017-08-15 NOTE — Progress Notes (Signed)
PCP: Renford DillsPolite, Ronald, MD  Clinic Note: Chief Complaint  Patient presents with  . Follow-up    Myoview stress test for episodic chest pain no further chest pain;     HPI: Seth Cox is a 67 y.o. male who is being seen today for follow-up evaluation of Atypical Chest Pain at the request of Renford DillsPolite, Ronald, MD  His only with the past medical history is BPH as well as elevated blood pressure without diagnosis of hypertension.  He is a 1 pack/day smoker-->   Quit Sept 2018 with the aid of Zyban.  He also does drink some alcohol but not to excess.  Seth Cox was seen on July 11, 2017 w/ c/o CP -  intermittent episodes of central/substernal chest discomfort that happens off and on not necessarily associated with any particular activity.  It is described as a sharp pressure-like discomfort and episodes may last anywhere from 2-5 minutes.  Sometimes he feels a little lightheaded and heart skipping when these episodes occur, and often they are associated with dyspnea.  The discomfort can occur both at rest and with exertion, and oftentimes occurs when he lies down at night.  Recent Hospitalizations: None  Studies Personally Reviewed - (if available, images/films reviewed: From Epic Chart or Care Everywhere)  Myoview (07/22/17): INTERMEDIATE RISK b/c reduced LVEF: Probably normal with likely soft tissue attenuation - cannot exclude Inferior Infarct - w/o ischemia. EF 46%. --> Echo ordered  Echo (08/12/17): EF 60-65%. No RWMA. Gr 1 DD. Otw- normal valves.  Interval History: Seth Cox presents here today to f/u from ST with no major complaints.  He indicates that he is not had any further episodes of his substernal chest discomfort.  He also notes his palpitations seem to have been controlled except if he has a big cup of coffee. He denies any recurrence of chest tightness pressure with rest or exertion now.  No exertional dyspnea.  Palpitations have resolved with no recurrent irregular  heartbeats.  No syncope/near syncope or TIA/amaurosis fugax. Cardiovascular ROS: no chest pain or dyspnea on exertion positive for - Rare palpitations negative for - edema, loss of consciousness, orthopnea, paroxysmal nocturnal dyspnea, rapid heart rate, shortness of breath or Syncope/near syncope, TIA/amaurosis fugax, claudication  ROS: A comprehensive was performed. Review of Systems  Constitutional: Positive for malaise/fatigue (He does usually get tired about midway through the day.).  HENT: Negative for congestion and nosebleeds.   Respiratory: Negative for cough and shortness of breath.   Cardiovascular: Negative for claudication.  Gastrointestinal: Negative for blood in stool, heartburn and melena.  Genitourinary: Negative for hematuria.  Musculoskeletal: Positive for joint pain (Mild arthritis pains.).  Neurological: Positive for dizziness.       Lightheadedness with mild dizziness occasionally.    Psychiatric/Behavioral: Negative for memory loss. The patient is not nervous/anxious and does not have insomnia.   All other systems reviewed and are negative.   I have reviewed and (if needed) personally updated the patient's problem list, medications, allergies, past medical and surgical history, social and family history.   Past Medical History:  Diagnosis Date  . Elevated heart rate with elevated blood pressure without diagnosis of hypertension   . History of BPH     Past Surgical History:  Procedure Laterality Date  . MANDIBLE FRACTURE SURGERY      Current Meds  Medication Sig  . celecoxib (CELEBREX) 200 MG capsule Take 1 capsule by mouth daily.  Marland Kitchen. levofloxacin (LEVAQUIN) 500 MG tablet Take 500 mg by  mouth daily.  Marland Kitchen oxybutynin (DITROPAN-XL) 10 MG 24 hr tablet Take 10 mg by mouth daily.  . tamsulosin (FLOMAX) 0.4 MG CAPS capsule Take 1 capsule by mouth at bedtime.    Allergies  Allergen Reactions  . Ibuprofen     Social History   Tobacco Use  . Smoking status:  Former Smoker    Packs/day: 1.00    Types: Cigarettes  . Smokeless tobacco: Never Used  Substance Use Topics  . Alcohol use: Yes    Comment: occasional  . Drug use: No   Social History   Social History Narrative   He is currently single, but clearly has a significant other who is with him today.  He lives alone.  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   He quit smoking in September 2018.  He may be has 4 drinks of vodka in the course of the week.  He does not drink when he is on the road driving.    family history includes Fibromyalgia in his mother; Healthy in his sister; Heart failure in his sister; Hypertension in his brother.  Wt Readings from Last 3 Encounters:  08/15/17 215 lb 6.4 oz (97.7 kg)  07/22/17 210 lb (95.3 kg)  07/11/17 210 lb 6.4 oz (95.4 kg)    PHYSICAL EXAM BP (!) 151/85   Pulse (!) 56   Ht 6' (1.829 m)   Wt 215 lb 6.4 oz (97.7 kg)   SpO2 98%   BMI 29.21 kg/m  Physical Exam  Constitutional: He is oriented to person, place, and time. He appears well-developed and well-nourished. No distress.  HENT:  Head: Normocephalic and atraumatic.  Neck: No hepatojugular reflux and no JVD present. Carotid bruit is not present.  Cardiovascular: Regular rhythm, normal heart sounds, intact distal pulses and normal pulses.  No extrasystoles are present. Bradycardia present. PMI is not displaced. Exam reveals no gallop and no friction rub.  No murmur heard. Pulmonary/Chest: Effort normal and breath sounds normal. No respiratory distress. He has no wheezes. He exhibits no tenderness (Not able to reproduce pain on palpation.).  Musculoskeletal: Normal range of motion. He exhibits no edema.  Neurological: He is alert and oriented to person, place, and time.  Psychiatric: He has a normal mood and affect. His behavior is normal. Judgment and thought content normal.  Nursing note and vitals reviewed.    Adult ECG Report Not checked  Other studies  Reviewed: Additional studies/ records that were reviewed today include:  Recent Labs: CBC with WBC 4.7 H/H 14.1/42.6.  Platelets 197   ASSESSMENT / PLAN: Problem List Items Addressed This Visit    Sinus bradycardia (Chronic)    Resting heart rate in the 50s on no medications. Excellent heart rate responsiveness on treadmill with no signs of chronotropic incompetence.  Would simply avoid AV nodal agents for treating blood pressure.      Elevated blood pressure reading without diagnosis of hypertension - Primary    Blood pressure still high day.  This is a new finding for him.  He is not on any medications.  Mild LVH with grade 1 diastolic dysfunction on echo would suggest that he may have had hypertension ongoing for a while.  Defer to PCP, but would avoid AV nodal agents such as Non-dihydropyridine calcium channel blockers (diltiazem/verapamil) or beta-blockers.      Chest pain with moderate risk for cardiac etiology    Relatively concerning symptoms on initial evaluation, no longer having pain.  This along with a negative Myoview for ischemia would argue against active CAD. I suspect that the Myoview with the soft tissue attenuation lead to a false reading on the EF being somewhat decreased with inferior wall motion normality.  We checked this out with an echocardiogram which revealed no wall motion abnormality and normal pump function.  Low likelihood of being cardiac in nature.  Suspect probably GI or musculoskeletal symptoms.         Current medicines are reviewed at length with the patient today. (+/- concerns) none The following changes have been made:None  Patient Instructions  Medication Instructions:  No medication changes  Follow-Up: Your physician recommends that you schedule a follow-up appointment as needed     No orders of the defined types were placed in this encounter.     Bryan Lemma, M.D., M.S. Interventional Cardiologist   Pager #  [phone] Phone # [phone] [address] [place], [place] [address]   Thank you for choosing [facility]!!

## 2017-08-16 ENCOUNTER — Telehealth: Payer: Self-pay | Admitting: *Deleted

## 2017-08-16 NOTE — Telephone Encounter (Signed)
LEFT  DETAILED ECHO RESULT MESSAGE  per orders DPI, ANY QUESTION MAY CALL BACK

## 2017-08-16 NOTE — Telephone Encounter (Signed)
-----   Message from Marykay Lexavid W Harding, MD sent at 08/14/2017  2:32 PM EDT ----- Echocardiogram shows normal left ventricle size and function.  Ejection fraction is 60-65%.  Not unexpected grade 1 diastolic dysfunction.  No valve lesions. Pretty much normal echo   Bryan Lemmaavid Harding, MD

## 2017-11-18 ENCOUNTER — Other Ambulatory Visit: Payer: Self-pay | Admitting: Geriatric Medicine

## 2017-11-18 ENCOUNTER — Ambulatory Visit
Admission: RE | Admit: 2017-11-18 | Discharge: 2017-11-18 | Disposition: A | Discharge status: Home / Self Care | Payer: 59 | Source: Ambulatory Visit | Attending: Geriatric Medicine | Admitting: Geriatric Medicine

## 2017-11-18 DIAGNOSIS — R05 Cough: Secondary | ICD-10-CM

## 2017-11-18 DIAGNOSIS — R059 Cough, unspecified: Secondary | ICD-10-CM

## 2018-07-14 ENCOUNTER — Ambulatory Visit: Payer: Medicare Other | Admitting: Podiatry

## 2018-07-14 ENCOUNTER — Ambulatory Visit (INDEPENDENT_AMBULATORY_CARE_PROVIDER_SITE_OTHER): Payer: 59

## 2018-07-14 ENCOUNTER — Ambulatory Visit (INDEPENDENT_AMBULATORY_CARE_PROVIDER_SITE_OTHER): Payer: 59 | Admitting: Podiatry

## 2018-07-14 ENCOUNTER — Encounter: Payer: Self-pay | Admitting: Podiatry

## 2018-07-14 DIAGNOSIS — M722 Plantar fascial fibromatosis: Secondary | ICD-10-CM

## 2018-07-14 DIAGNOSIS — M7731 Calcaneal spur, right foot: Secondary | ICD-10-CM | POA: Diagnosis not present

## 2018-07-14 MED ORDER — METHYLPREDNISOLONE 4 MG PO TBPK: ORAL_TABLET | ORAL | 0 refills | Status: DC

## 2018-07-14 NOTE — Patient Instructions (Signed)

## 2018-07-14 NOTE — Progress Notes (Signed)
   Subjective:    Patient ID: Seth Cox, male    DOB: 20-Oct-1950, 68 y.o.   MRN: [id number]  HPI 68 year old male presents the office today for concerns of right heel pain, plantar fasciitis.  He states he previously had plantar fascia surgery back in 2003 he was doing well for some time however over the last 3 to 4 months he started to have reoccurrence.  Majority of pain is in the morning when he first gets up or after sitting for some time.  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  He denies any recent injury or trauma.  No numbness or tingling.  No recent treatment.   Review of Systems  All other systems reviewed and are negative.  Past Medical History:  Diagnosis Date  . Elevated heart rate with elevated blood pressure without diagnosis of hypertension   . History of BPH     Past Surgical History:  Procedure Laterality Date  . MANDIBLE FRACTURE SURGERY       Current Outpatient Medications:  .  celecoxib (CELEBREX) 200 MG capsule, Take 1 capsule by mouth daily., Disp: , Rfl: 1 .  levofloxacin (LEVAQUIN) 500 MG tablet, Take 500 mg by mouth daily., Disp: , Rfl:  .  methylPREDNISolone (MEDROL DOSEPAK) 4 MG TBPK tablet, Take as directed, Disp: 21 tablet, Rfl: 0 .  oxybutynin (DITROPAN-XL) 10 MG 24 hr tablet, Take 10 mg by mouth daily., Disp: , Rfl: 11 .  tamsulosin (FLOMAX) 0.4 MG CAPS capsule, Take 1 capsule by mouth at bedtime., Disp: , Rfl: 11  Allergies  Allergen Reactions  . Ibuprofen         Objective:   Physical Exam  General: AAO x3, NAD  Dermatological: Skin is warm, dry and supple bilateral. Nails x 10 are well manicured; remaining integument appears unremarkable at this time. There are no open sores, no preulcerative lesions, no rash or signs of infection present.  Vascular: Dorsalis Pedis artery and Posterior Tibial artery pedal pulses are 2/4 bilateral with immedate capillary fill time. Pedal hair growth  present. No varicosities and no lower extremity edema present bilateral. There is no pain with calf compression, swelling, warmth, erythema.   Neruologic: Grossly intact via light touch bilateral. Vibratory intact via tuning fork bilateral. Protective threshold with Semmes Wienstein monofilament intact to all pedal sites bilateral. Negative tinel sign.   Musculoskeletal: Tenderness to palpation along the plantar medial tubercle of the calcaneus at the insertion of plantar fascia on the right foot. There is no pain along the course of the plantar fascia within the arch of the foot. Plantar fascia appears to be intact. There is no pain with lateral compression of the calcaneus or pain with vibratory sensation. There is no pain along the course or insertion of the achilles tendon. No other areas of tenderness to bilateral lower extremities. Muscular strength 5/5 in all groups tested bilateral. Equinus is present   Gait: Unassisted, Nonantalgic.     Assessment & Plan:  68 year old male right heel pain, recurrent plantar fasciitis -Treatment options discussed including all alternatives, risks, and complications -Etiology of symptoms were discussed -X-rays were obtained and reviewed with the patient.  No evidence of acute fracture or stress fracture.  Calcaneal spurring is present. -Medrol Dosepak prescribed. -Plantar fascial brace dispensed -Night splint dispensed -Stretching, icing exercises daily. -Discussed shoe modifications and orthotics  Vivi Barrack DPM

## 2018-07-15 DIAGNOSIS — M722 Plantar fascial fibromatosis: Secondary | ICD-10-CM | POA: Insufficient documentation

## 2018-08-11 ENCOUNTER — Other Ambulatory Visit: Payer: Self-pay

## 2018-08-11 ENCOUNTER — Encounter: Payer: Self-pay | Admitting: Podiatry

## 2018-08-11 ENCOUNTER — Ambulatory Visit (INDEPENDENT_AMBULATORY_CARE_PROVIDER_SITE_OTHER): Payer: 59 | Admitting: Podiatry

## 2018-08-11 VITALS — Temp 98.6°F

## 2018-08-11 DIAGNOSIS — M722 Plantar fascial fibromatosis: Secondary | ICD-10-CM

## 2018-08-11 MED ORDER — TRIAMCINOLONE ACETONIDE 10 MG/ML IJ SUSP
10.0000 mg | Freq: Once | INTRAMUSCULAR | Status: AC
  Administered 2018-08-11: 10 mg

## 2018-08-11 NOTE — Patient Instructions (Signed)

## 2018-08-14 NOTE — Progress Notes (Signed)
Subjective: 68 year old male presents the office today for concerns of right heel pain.  He states that overall he is doing somewhat better however he is still getting discomfort.  The brace does help.  No recent injury or trauma or any changes.  He is still taking Celebrex daily.  Denies any systemic complaints such as fevers, chills, nausea, vomiting. No acute changes since last appointment, and no other complaints at this time.   Objective: AAO x3, NAD DP/PT pulses palpable bilaterally, CRT less than 3 seconds There is continuation of tenderness palpation on the plantar medial tubercle of the calcaneus at the insertion of the plantar fascia on the right side.  Plantar fascial peers to be intact.  No pain with lateral compression of calcaneus along the Achilles tendon.  No other areas of tenderness elicited at this time.  Negative Tinel sign.  No open lesions or pre-ulcerative lesions.  No pain with calf compression, swelling, warmth, erythema  Assessment: Right heel pain, plantar fasciitis  Plan: -All treatment options discussed with the patient including all alternatives, risks, complications.  -Steroid injection performed today.  See procedure note below.  Continue stretching, ice exercises daily.  Discussed shoe modifications and orthotics.  Continue with the brace as well. -Patient encouraged to call the office with any questions, concerns, change in symptoms.   Procedure: Injection Tendon/Ligament Discussed alternatives, risks, complications and verbal consent was obtained.  Location: RIGHT plantar fascia at the glabrous junction; medial approach. Skin Prep: Alcohol  Injectate: 0.5cc 0.5% marcaine plain, 0.5 cc 2% lidocaine plain and, 1 cc kenalog 10. Disposition: Patient tolerated procedure well. Injection site dressed with a band-aid.  Post-injection care was discussed and return precautions discussed.   Return in about 4 weeks (around 09/08/2018).  Vivi Barrack DPM

## 2018-08-31 ENCOUNTER — Encounter: Payer: Self-pay | Admitting: Podiatry

## 2018-08-31 ENCOUNTER — Other Ambulatory Visit: Payer: Self-pay

## 2018-08-31 ENCOUNTER — Ambulatory Visit (INDEPENDENT_AMBULATORY_CARE_PROVIDER_SITE_OTHER): Payer: 59 | Admitting: Podiatry

## 2018-08-31 ENCOUNTER — Telehealth: Payer: Self-pay | Admitting: *Deleted

## 2018-08-31 VITALS — Temp 97.3°F

## 2018-08-31 DIAGNOSIS — M7731 Calcaneal spur, right foot: Secondary | ICD-10-CM | POA: Diagnosis not present

## 2018-08-31 DIAGNOSIS — T148XXA Other injury of unspecified body region, initial encounter: Secondary | ICD-10-CM

## 2018-08-31 DIAGNOSIS — M722 Plantar fascial fibromatosis: Secondary | ICD-10-CM | POA: Diagnosis not present

## 2018-08-31 MED ORDER — MELOXICAM 15 MG PO TABS: 15.0000 mg | ORAL_TABLET | Freq: Every day | ORAL | 0 refills | Status: DC

## 2018-08-31 NOTE — Telephone Encounter (Signed)
Pt called states he was seen in office today and received a boot that is currently still in the bag, he has an old boot that is still functioning and would like to know if it could be returned and removed from his bill. I skyped S. Durant - Account Coordinator and she stated pt could return the boot and charge removed.

## 2018-08-31 NOTE — Patient Instructions (Signed)

## 2018-08-31 NOTE — Progress Notes (Signed)
Subjective: 68 year old male presents the office today for follow-up evaluation of right heel pain, plantar fasciitis.  Overall he is doing about the same.  Injection was not helpful but upon partial bracing icing has been helping.  He states he has been in the morning when he first gets up.  He has been using the night splint.  He states he also has pain primarily calluses on his feet more.  He denies any pain at night that wakes him up. He denies any swelling no redness he has no numbness or tingling. Denies any systemic complaints such as fevers, chills, nausea, vomiting. No acute changes since last appointment, and no other complaints at this time.   Objective: AAO x3, NAD DP/PT pulses palpable bilaterally, CRT less than 3 seconds Discontinuation tenderness on the plantar medial tubercle of the calcaneus at insertion of plantar fascia on the right side.  Plantar fascial appears to be intact.  No pain on the central aspect of the plantar heel.  No pain posterior heel.  There is no area pinpoint tenderness.  No stability no erythema. Impression calcaneus.  Thompson test is negative. No open lesions or pre-ulcerative lesions.  No pain with calf compression, swelling, warmth, erythema Assessment: Right heel pain, plantar fasciitis   Plan: -All treatment options discussed with the patient including all alternatives, risks, complications.  -Since he did not get significant provement of the first injection we held off on another injection today.  I did prescribe meloxicam.  Although he states that he is allergic to ibuprofen he states he has been taking this medication he does not remember having an allergy to this.  Discussed side effects of medication. -Cam boot was dispensed today. -Continue to ice and elevate. -We discussed inserts today. He had custom previously back in 2003 and were helpful he reports. We will check insurance coverage for this. Message sent to [facility] today to check this.   -Due to his history of having plantar fascia surgery with recurrent pain which is continue to order MRI of the right foot.  Discussed with him that given the recent pandemic this would not be done for a few weeks.   Return in about 3 weeks (around 09/21/2018).  Vivi Barrack DPM  -Patient encouraged to call the office with any questions, concerns, change in symptoms.

## 2018-08-31 NOTE — Telephone Encounter (Signed)
I informed pt and he stated he would return the boot tomorrow.

## 2018-08-31 NOTE — Telephone Encounter (Signed)
Faxed MRI orders to [facility], orders given to Tomasa Hose, RN for pre-cert.

## 2018-08-31 NOTE — Telephone Encounter (Signed)
-----   Message from Vivi Barrack, DPM sent at 08/31/2018  9:30 AM EDT ----- Val- Can you order an MRI of the right side to look for plantar fascial tear? He has chronic heel pain on the right side and he had plantar fascial surgery several years ago. Thanks.   Dawn- can you please check orthotic coverage for new inserts?   Thanks.

## 2018-09-08 ENCOUNTER — Ambulatory Visit: Payer: Medicare Other | Admitting: Podiatry

## 2018-09-13 ENCOUNTER — Telehealth: Payer: Self-pay | Admitting: *Deleted

## 2018-09-13 NOTE — Telephone Encounter (Signed)
[employer] denied MRI [id number], may request a review for change of determination [id number], Customer ID:  [id number], Reference Code:  [id number].

## 2018-09-13 NOTE — Telephone Encounter (Signed)
-----   Message from Debe Coder sent at 09/13/2018 10:15 AM EDT ----- Regarding: Authorization Denied Insurance denied this patient's exam.   Will the provider do a peer to peer, or should the exam be canceled?  INS:      [employer] DOS:    09/15/2018 CPT:     81448       Denial Reason:   Based on [employer] [facility] Guidelines Section: [place] 1 General Guidelines, we cannot approve this request. Your records show that you have signs and/or symptoms with your muscles, bones, and/or joints. Guidelines support x-rays in the evaluation of all musculoskeletal conditions after the current episode of symptoms start or change. The clinical information received does not describe results of a plain x-ray that did not provide all of the details needed and, therefore, the request is not indicated at this time.    Thank you, Debe Coder

## 2018-09-14 NOTE — Telephone Encounter (Signed)
Seth Cox can you help me get this set up

## 2018-09-14 NOTE — Telephone Encounter (Signed)
PA approved from 09/12/18 AQ#[id number]

## 2018-09-15 ENCOUNTER — Ambulatory Visit
Admission: RE | Admit: 2018-09-15 | Discharge: 2018-09-15 | Disposition: A | Discharge status: Home / Self Care | Payer: 59 | Source: Ambulatory Visit | Attending: Podiatry | Admitting: Podiatry

## 2018-09-15 ENCOUNTER — Other Ambulatory Visit: Payer: Self-pay

## 2018-09-15 DIAGNOSIS — M7731 Calcaneal spur, right foot: Secondary | ICD-10-CM

## 2018-09-15 DIAGNOSIS — M722 Plantar fascial fibromatosis: Secondary | ICD-10-CM

## 2018-09-15 DIAGNOSIS — T148XXA Other injury of unspecified body region, initial encounter: Secondary | ICD-10-CM

## 2018-09-19 ENCOUNTER — Other Ambulatory Visit: Payer: Self-pay | Admitting: Podiatry

## 2018-09-19 MED ORDER — DICLOFENAC SODIUM 1 % TD GEL: 2.0000 g | Freq: Four times a day (QID) | TRANSDERMAL | 2 refills | Status: DC

## 2018-09-22 ENCOUNTER — Ambulatory Visit: Payer: Medicare Other | Admitting: Podiatry

## 2018-09-29 ENCOUNTER — Telehealth: Payer: Self-pay | Admitting: Podiatry

## 2018-09-29 ENCOUNTER — Encounter: Payer: Self-pay | Admitting: Podiatry

## 2018-09-29 ENCOUNTER — Ambulatory Visit (INDEPENDENT_AMBULATORY_CARE_PROVIDER_SITE_OTHER): Payer: 59 | Admitting: Podiatry

## 2018-09-29 ENCOUNTER — Other Ambulatory Visit: Payer: Self-pay

## 2018-09-29 VITALS — Temp 97.9°F

## 2018-09-29 DIAGNOSIS — M722 Plantar fascial fibromatosis: Secondary | ICD-10-CM

## 2018-09-29 DIAGNOSIS — T148XXA Other injury of unspecified body region, initial encounter: Secondary | ICD-10-CM

## 2018-09-29 DIAGNOSIS — M7750 Other enthesopathy of unspecified foot: Secondary | ICD-10-CM | POA: Diagnosis not present

## 2018-09-29 MED ORDER — DICLOFENAC SODIUM 75 MG PO TBEC: 75.0000 mg | DELAYED_RELEASE_TABLET | Freq: Two times a day (BID) | ORAL | 0 refills | Status: DC

## 2018-09-29 NOTE — Telephone Encounter (Signed)
Orthotics are an exclusion from pts plan. I have left a message for pt to call to discuss benefits.

## 2018-09-29 NOTE — Patient Instructions (Signed)
Stop the mobic and start the Voltaren.

## 2018-10-01 DIAGNOSIS — D696 Thrombocytopenia, unspecified: Secondary | ICD-10-CM | POA: Insufficient documentation

## 2018-10-01 DIAGNOSIS — R7303 Prediabetes: Secondary | ICD-10-CM | POA: Insufficient documentation

## 2018-10-01 NOTE — Progress Notes (Signed)
Subjective: 68-year male presents the office a follow-up evaluation of right heel pain and also for follow-up evaluation of the MRI results.  Since he is been wearing the cam boot he states that he has been doing better his pain is improved.  He denies recent injury or trauma any changes.  No swelling. Denies any systemic complaints such as fevers, chills, nausea, vomiting. No acute changes since last appointment, and no other complaints at this time.   Objective: AAO x3, NAD DP/PT pulses palpable bilaterally, CRT less than 3 seconds There is improved but yet continued tenderness palpation on the plantar medial tubercle of the calcaneus at insertion of the fascia on the right side.  Plantar fascial does get to be intact.  There is no pain on the course of the peroneal tendon.  There is no edema in the course the peroneal status post he was also given intact.  No erythema.  Intact.  No areas of tenderness. No open lesions or pre-ulcerative lesions.  No pain with calf compression, swelling, warmth, erythema  MRI 09/15/2018: 1. Severe plantar fasciitis with a small partial tear and subcortical reactive marrow edema at the calcaneal insertion. 2. Mild tendinosis of the peroneus brevis with a short-segment longitudinal split tear.  Assessment: Chronic heel pain, plantar fasciitis  Plan: -All treatment options discussed with the patient including all alternatives, risks, complications.  -Overall symptoms are improving in the boot.  Also continue cam boot for now.  Continue ice, stretching, ice exercises daily.  Switch anti-inflammatories and use Voltaren to see if this does better for him.  We also discussed surgical intervention.  He has previously had surgery for this.  The last couple weeks it is not improving at that point we will proceed with endoscopic plantar fascial release with PRP injection likely. -We try checking orthotic coverage coverage excluded on this policy. Dawn Bennett Scrape was going to  call and let him know. Discussed OTC inserts for when he goes back in to the shoe is not able to do the custom.  -Patient encouraged to call the office with any questions, concerns, change in symptoms.   Vivi Barrack DPM

## 2018-10-13 ENCOUNTER — Ambulatory Visit (INDEPENDENT_AMBULATORY_CARE_PROVIDER_SITE_OTHER): Payer: 59 | Admitting: Podiatry

## 2018-10-13 ENCOUNTER — Encounter: Payer: Self-pay | Admitting: Podiatry

## 2018-10-13 ENCOUNTER — Other Ambulatory Visit: Payer: Self-pay

## 2018-10-13 VITALS — Temp 97.5°F

## 2018-10-13 DIAGNOSIS — M722 Plantar fascial fibromatosis: Secondary | ICD-10-CM

## 2018-10-13 NOTE — Patient Instructions (Signed)

## 2018-10-14 NOTE — Progress Notes (Signed)
Subjective: 68-year male presents the office a follow-up evaluation of right heel pain, plantar fasciitis.  Overall he states that he is doing better.  He does state that he was in the yard a lot at work the other day he states that he had a lot of pain towards the end of the day.  He went to begin working with both that seem to help quite a bit.  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  Overall he feels that he is improving.  No acute changes since last appointment, and no other complaints at this time.   Objective: AAO x3, NAD DP/PT pulses palpable bilaterally, CRT less than 3 seconds There is improved but still some continued tenderness palpation on the plantar medial tubercle of the calcaneus at insertion of the fascia on the right side.  There is minimal edema to the area there is no erythema or warmth.  Negative Tinel sign.  No pain with lateral compression of calcaneus or the Achilles tendon.  Thompson test is negative.  No pain to the peroneal tendons. No pain with calf compression, swelling, warmth, erythema  MRI 09/15/2018: 1. Severe plantar fasciitis with a small partial tear and subcortical reactive marrow edema at the calcaneal insertion. 2. Mild tendinosis of the peroneus brevis with a short-segment longitudinal split tear.  Assessment: Chronic heel pain, plantar fasciitis  Plan: -All treatment options discussed with the patient including all alternatives, risks, complications.  -Overall he states he is improving.  Discussed again both conservative as well as surgical treatment options.  At this time is to continue with conservative care.  I want him to continue this for now it is been very helpful but I want to start to wean out of the boot back into regular shoe with orthotic.  Power steps were dispensed today.  Continue with stretching, icing daily.  Next 2 to 4 weeks did not see significant improvement discussed surgical intervention, endoscopic plantar fascial  release  Vivi Barrack DPM

## 2018-11-10 ENCOUNTER — Ambulatory Visit (INDEPENDENT_AMBULATORY_CARE_PROVIDER_SITE_OTHER): Payer: 59 | Admitting: Podiatry

## 2018-11-10 ENCOUNTER — Encounter: Payer: Self-pay | Admitting: Podiatry

## 2018-11-10 ENCOUNTER — Other Ambulatory Visit: Payer: Self-pay

## 2018-11-10 VITALS — Temp 97.6°F

## 2018-11-10 DIAGNOSIS — M722 Plantar fascial fibromatosis: Secondary | ICD-10-CM | POA: Diagnosis not present

## 2018-11-10 DIAGNOSIS — B353 Tinea pedis: Secondary | ICD-10-CM | POA: Diagnosis not present

## 2018-11-10 DIAGNOSIS — L988 Other specified disorders of the skin and subcutaneous tissue: Secondary | ICD-10-CM | POA: Diagnosis not present

## 2018-11-10 MED ORDER — NAFTIN 2 % EX GEL: 1.0000 "application " | Freq: Every day | CUTANEOUS | 0 refills | Status: DC

## 2018-11-10 NOTE — Progress Notes (Signed)
Subjective: 68 year old male presents the office today for follow dilation of right heel pain, plantar fasciitis.  His overall he is doing much better.  He has very minimal discomfort.  Is been doing the stretching, icing as well as wearing the boot at times.  Is definitely a regular shoe without any significant discomfort.  He has a new concern today of healing in between his toes mostly on the third and fourth interspaces.  Has been using antifungal spray recently.  He is try to dry it between his toes.  No drainage or pus or any pain. Denies any systemic complaints such as fevers, chills, nausea, vomiting. No acute changes since last appointment, and no other complaints at this time.   Objective: AAO x3, NAD DP/PT pulses palpable bilaterally, CRT less than 3 seconds At this time there is very minimal tenderness palpation of the plantar medial tubercle of the calcaneus at the insertion of plantar fascia on the right foot.  Plantar fascia appears to be intact.  Achilles tendon intact.  No pain the arch of the foot.  No swelling.  On the third and fourth interspaces bilaterally the right side worse than the left there is evidence of tinea pedis, interdigital maceration.  No drainage or pustules present. No open lesions or pre-ulcerative lesions.  No pain with calf compression, swelling, warmth, erythema  Assessment: Resolving right heel pain, plantar fasciitis; tinea pedis/skin maceration  Plan: -All treatment options discussed with the patient including all alternatives, risks, complications.  -Regards to the right heel pain he is doing much better.  I told him to continue with stretching, icing daily as well as wearing supportive shoes and orthotics.  He is anti-inflammatories as needed.  As he is doing much better with a hold off on surgical intervention.  However if the pain comes back or continues we can reconsider. -Applied Geophysicist/field seismologist.  Prescribed Naftin gel. -Patient  encouraged to call the office with any questions, concerns, change in symptoms.   Trula Slade DPM

## 2018-11-29 ENCOUNTER — Other Ambulatory Visit: Payer: Self-pay | Admitting: *Deleted

## 2018-11-29 DIAGNOSIS — B353 Tinea pedis: Secondary | ICD-10-CM

## 2018-11-29 MED ORDER — KETOCONAZOLE 2 % EX CREA: TOPICAL_CREAM | Freq: Every day | CUTANEOUS | Status: DC

## 2018-11-29 NOTE — Telephone Encounter (Signed)
Called and spoke with the patient stating the jublia was not covered by insurance and per Dr Jacqualyn Posey called into the pharmacy a RX of the ketoconazole cream. Lattie Haw

## 2018-12-08 ENCOUNTER — Ambulatory Visit: Payer: Medicare Other | Admitting: Podiatry

## 2018-12-22 ENCOUNTER — Other Ambulatory Visit: Payer: Self-pay

## 2018-12-22 ENCOUNTER — Other Ambulatory Visit: Payer: Self-pay | Admitting: Podiatry

## 2018-12-22 ENCOUNTER — Telehealth: Payer: Self-pay | Admitting: Podiatry

## 2018-12-22 ENCOUNTER — Encounter: Payer: Self-pay | Admitting: Podiatry

## 2018-12-22 ENCOUNTER — Ambulatory Visit (INDEPENDENT_AMBULATORY_CARE_PROVIDER_SITE_OTHER): Payer: 59 | Admitting: Podiatry

## 2018-12-22 VITALS — Temp 98.1°F

## 2018-12-22 DIAGNOSIS — L309 Dermatitis, unspecified: Secondary | ICD-10-CM | POA: Diagnosis not present

## 2018-12-22 DIAGNOSIS — B353 Tinea pedis: Secondary | ICD-10-CM | POA: Diagnosis not present

## 2018-12-22 DIAGNOSIS — L988 Other specified disorders of the skin and subcutaneous tissue: Secondary | ICD-10-CM | POA: Diagnosis not present

## 2018-12-22 MED ORDER — TRIAMCINOLONE ACETONIDE 0.1 % EX CREA: 1.0000 "application " | TOPICAL_CREAM | Freq: Two times a day (BID) | CUTANEOUS | 0 refills | Status: DC

## 2018-12-22 MED ORDER — NAFTIN 2 % EX GEL: 1.0000 "application " | Freq: Every day | CUTANEOUS | 0 refills | Status: DC

## 2018-12-22 MED ORDER — CLOTRIMAZOLE-BETAMETHASONE 1-0.05 % EX CREA: 1.0000 "application " | TOPICAL_CREAM | Freq: Two times a day (BID) | CUTANEOUS | 0 refills | Status: AC

## 2018-12-22 NOTE — Telephone Encounter (Signed)
Please advise. Thanks.  

## 2018-12-22 NOTE — Telephone Encounter (Signed)
Pt had a prescription for Naftifine HCI sent in to his pharmacy and when he went to pick it up it was very expensive >$200.  Pt would like to know if there is something else that can be prescribed. Please give patient a call.

## 2018-12-22 NOTE — Telephone Encounter (Signed)
Per Dr Jacqualyn Posey sent another medication to the pharmacy and called patient. Seth Cox

## 2018-12-22 NOTE — Progress Notes (Signed)
Subjective: 67 year old male presents the office today for concerns of itching to his right foot.  He had a rash on the top of his right foot is been getting larger and he does appreciate the area.  He states that between the toe is doing better he never got the Naftin gel.  Is healed and doing well and not having any pain currently.  He is some occasional discomfort but overall much improved. Denies any systemic complaints such as fevers, chills, nausea, vomiting. No acute changes since last appointment, and no other complaints at this time.   Objective: AAO x3, NAD DP/PT pulses palpable bilaterally, CRT less than 3 seconds On the dorsal aspect the right foot is a 4 x 4 centimeter skin lesion which appears to be more dermatitis and is erythematous base and is pruritic.  There is no drainage or pus.  No swelling.  Skin maceration present to the right third and fourth interspace.  Tinea pedis is present.  No drainage or pus coming from the area. There is no pain on the plantar medial tubercle of the calcaneus at the insertion of plantar fascia on the right side.  Plantar fascial appears to be intact.  No other areas of discomfort. No open lesions or pre-ulcerative lesions.  No pain with calf compression, swelling, warmth, erythema  Assessment: Right foot dermatitis, tinea pedis; resolving plantar fasciitis   Plan: -All treatment options discussed with the patient including all alternatives, risks, complications.  -Order triamcinolone cream for the skin rash on the dorsal foot.  We ordered Naftin gel to apply interdigitally.  Applied [employer] today.  Discussed drying thoroughly between his toes.  Discussed washing his boat with Dial soap and water and dry thoroughly afterwards. -In regards to the heel pain continue stretching, icing exercises daily as well as supportive shoes.  Continue Voltaren gel as needed. -Patient encouraged to call the office with any questions, concerns, change in  symptoms.   Return in about 4 weeks (around 01/19/2019), or if symptoms worsen or fail to improve.  Trula Slade DPM

## 2018-12-22 NOTE — Telephone Encounter (Signed)
I sent Lotrisone to his pharmacy.

## 2018-12-28 ENCOUNTER — Telehealth: Payer: Self-pay | Admitting: *Deleted

## 2018-12-28 NOTE — Telephone Encounter (Signed)
[facility] faxed over request new RX and patient has gotten another type of medicine and I sent the request back to [facility] [address] [place] and faxed it to [phone]. Lattie Haw

## 2019-01-29 ENCOUNTER — Ambulatory Visit: Payer: Medicare Other | Admitting: Podiatry

## 2019-04-16 ENCOUNTER — Other Ambulatory Visit: Payer: Self-pay | Admitting: Podiatry

## 2019-06-19 ENCOUNTER — Other Ambulatory Visit: Payer: Self-pay | Admitting: Podiatry

## 2019-06-20 ENCOUNTER — Telehealth: Payer: Self-pay | Admitting: *Deleted

## 2019-06-20 NOTE — Telephone Encounter (Signed)
Per Dr Ardelle Anton ok to refill the kenalog cream for the patient and was sent over to [facility] on [address] and I called the patient to let him know that we sent over the cream and patient stated that he was doing much better with the cream and wanted to stay ahead of the game and I stated to call the office if any concerns or questions. Seth Cox

## 2019-08-22 ENCOUNTER — Other Ambulatory Visit: Payer: Self-pay | Admitting: Podiatry

## 2019-08-30 ENCOUNTER — Encounter: Payer: Self-pay | Admitting: Emergency Medicine

## 2019-08-30 ENCOUNTER — Ambulatory Visit
Admission: EM | Admit: 2019-08-30 | Discharge: 2019-08-30 | Disposition: A | Discharge status: Home / Self Care | Payer: 59 | Attending: Physician Assistant | Admitting: Physician Assistant

## 2019-08-30 ENCOUNTER — Other Ambulatory Visit: Payer: Self-pay

## 2019-08-30 DIAGNOSIS — K5900 Constipation, unspecified: Secondary | ICD-10-CM | POA: Diagnosis not present

## 2019-08-30 DIAGNOSIS — R519 Headache, unspecified: Secondary | ICD-10-CM

## 2019-08-30 MED ORDER — MINERAL OIL RE ENEM: 1.0000 | ENEMA | Freq: Once | RECTAL | 0 refills | Status: AC

## 2019-08-30 MED ORDER — DOCUSATE SODIUM 250 MG PO CAPS: 250.0000 mg | ORAL_CAPSULE | Freq: Every day | ORAL | 0 refills | Status: DC

## 2019-08-30 MED ORDER — PREDNISONE 50 MG PO TABS: 50.0000 mg | ORAL_TABLET | Freq: Every day | ORAL | 0 refills | Status: DC

## 2019-08-30 NOTE — ED Triage Notes (Signed)
last bowel movement was 2 weeks ago.  Patient has had a headache for a week, feeling tired for a week

## 2019-08-30 NOTE — ED Provider Notes (Signed)
[facility]    CSN: [id number] Arrival date & time: 08/30/19  1921      History   Chief Complaint Chief Complaint  Patient presents with  . Constipation    HPI Seth Cox is a 69 y.o. male.   69 year old male comes in for multiple complaints  1. 2 week history of constipation. States have not had BM for 2 weeks. Denies abdominal pain, nausea, vomiting. Still passing gas. No changes in diet. Has been taking miralax for 1 week without relief. Last colonoscopy 3 months ago without problems. Denies history of abdominal surgery.  2. Intermittent headache for the past few days. Denies worse headache, thunderclap headache. Resolves with tylenol. Denies photophobia, nausea, vomiting. Denies fever, chills. Denies vision changes, dizziness, weakness, syncope. Has had 2-3 week history of dry cough. Mild dyspnea on exertion. Has had chest pain in the past, denies current chest pain. Denies exertional chest pain, exertional fatigue. Former smoker. COVID vaccine finished 1 month ago.     Past Medical History:  Diagnosis Date  . Elevated heart rate with elevated blood pressure without diagnosis of hypertension   . History of BPH     Patient Active Problem List   Diagnosis Date Noted  . 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 Prediabetes 10/01/2018  . Plantar fasciitis 07/15/2018  . Chest pain with moderate risk for cardiac etiology 07/11/2017  . Sinus bradycardia 07/11/2017  . Elevated blood pressure reading without diagnosis of hypertension   . History of BPH     Past Surgical History:  Procedure Laterality Date  . MANDIBLE FRACTURE SURGERY         Home Medications    Prior to Admission medications   Medication Sig Start Date End Date Taking? Authorizing Provider  celecoxib (CELEBREX) 200 MG capsule Take 1 capsule by mouth daily. 06/09/17  Yes [provider]  oxybutynin (DITROPAN-XL) 10 MG 24 hr tablet Take 10 mg by mouth daily. 05/18/17   Yes [provider]  pantoprazole (PROTONIX) 20 MG tablet TAKE 1 TABLET BY MOUTH ONCE A DAY BEFORE A MEAL 07/08/18  Yes [provider]  tamsulosin (FLOMAX) 0.4 MG CAPS capsule Take 1 capsule by mouth at bedtime. 06/05/17  Yes [provider]  triamcinolone cream (KENALOG) 0.1 % APPLY TO AFFECTED AREA TWICE A DAY. NEED OFFICE VISIT 08/23/19  Yes Trula Slade, DPM  clotrimazole-betamethasone (LOTRISONE) cream Apply 1 application topically 2 (two) times daily. 12/22/18   Trula Slade, DPM  docusate sodium (COLACE) 250 MG capsule Take 1 capsule (250 mg total) by mouth daily. 08/30/19   Tasia Catchings, Senaya Dicenso V, PA-C  mineral oil enema Place 133 mLs (1 enema total) rectally once for 1 dose. 08/30/19 08/30/19  Ok Edwards, PA-C  predniSONE (DELTASONE) 50 MG tablet Take 1 tablet (50 mg total) by mouth daily with breakfast. 08/30/19   Tasia Catchings, Hakim Minniefield V, PA-C  loratadine (CLARITIN) 10 MG tablet loratadine 10 mg tablet  Take 1 tablet every day by oral route for 90 days.  08/30/19  [provider]    Family History Family History  Problem Relation Age of Onset  . Fibromyalgia Mother   . Healthy Sister   . Hypertension Brother   . Heart failure Sister        Died at age 33.  Had some type of cardiomyopathy and was pending transplant    Social History Social History   Tobacco Use  . Smoking status: Former Smoker  Packs/day: 1.00    Types: Cigarettes  . Smokeless tobacco: Never Used  Substance Use Topics  . Alcohol use: Yes    Comment: occasional  . Drug use: No     Allergies   Patient has no known allergies.   Review of Systems Review of Systems  Reason unable to perform ROS: See HPI as above.     Physical Exam Triage Vital Signs ED Triage Vitals  Enc Vitals Group     BP 08/30/19 1950 134/86     Pulse Rate 08/30/19 1950 81     Resp 08/30/19 1950 16     Temp 08/30/19 1950 99.4 F (37.4 C)     Temp Source 08/30/19 1950 Oral     SpO2 08/30/19 1950 96 %      Weight --      Height --      Head Circumference --      Peak Flow --      Pain Score 08/30/19 2007 1     Pain Loc --      Pain Edu? --      Excl. in GC? --    No data found.  Updated Vital Signs BP 134/86 (BP Location: Left Arm)   Pulse 81   Temp 99.4 F (37.4 C) (Oral)   Resp 16   SpO2 96%   Visual Acuity Right Eye Distance:   Left Eye Distance:   Bilateral Distance:    Right Eye Near:   Left Eye Near:    Bilateral Near:     Physical Exam Constitutional:      General: He is not in acute distress.    Appearance: Normal appearance. He is not ill-appearing, toxic-appearing or diaphoretic.  HENT:     Head: Normocephalic and atraumatic.     Right Ear: Ear canal and external ear normal. A middle ear effusion is present. Tympanic membrane is not erythematous or bulging.     Left Ear: Tympanic membrane, ear canal and external ear normal. Tympanic membrane is not erythematous or bulging.     Nose:     Right Sinus: No maxillary sinus tenderness or frontal sinus tenderness.     Left Sinus: No maxillary sinus tenderness or frontal sinus tenderness.     Mouth/Throat:     Mouth: Mucous membranes are moist.     Pharynx: Oropharynx is clear. Uvula midline.  Cardiovascular:     Rate and Rhythm: Normal rate and regular rhythm.     Heart sounds: Normal heart sounds. No murmur. No friction rub. No gallop.   Pulmonary:     Effort: Pulmonary effort is normal. No accessory muscle usage, prolonged expiration, respiratory distress or retractions.     Comments: Lungs clear to auscultation without adventitious lung sounds. Abdominal:     General: Bowel sounds are normal.     Palpations: Abdomen is soft.     Tenderness: There is no abdominal tenderness. There is no right CVA tenderness, left CVA tenderness, guarding or rebound.  Musculoskeletal:     Cervical back: Normal range of motion and neck supple.  Neurological:     General: No focal deficit present.     Mental Status: He is alert  and oriented to person, place, and time.      UC Treatments / Results  Labs (all labs ordered are listed, but only abnormal results are displayed) Labs Reviewed - No data to display  EKG   Radiology No results found.  Procedures Procedures (including critical care  time)  Medications Ordered in UC Medications - No data to display  Initial Impression / Assessment and Plan / UC Course  I have reviewed the triage vital signs and the nursing notes.  Pertinent labs & imaging results that were available during my care of the patient were reviewed by me and considered in my medical decision making (see chart for details).    1. Constipation Though concerning with no BM in 2 weeks, patient without abd pain, nausea, vomiting. Up to date colonoscopty. abd soft, +BS, nontender. ?fecal impaction. Will try mineral oil enema and colace. However, discussed will need close follow up. Return precautions given.  2. Sinus headache History and exam consistent with sinus headache. Prednisone as directed. Will have patient monitor dyspnea on exertion, if continues, to follow up with PCP. Strict return precautions given.   Patient expresses understanding and agrees to plan.  Final Clinical Impressions(s) / UC Diagnoses   Final diagnoses:  Constipation, unspecified constipation type  Sinus headache    ED Prescriptions    Medication Sig Dispense Auth. Provider   mineral oil enema Place 133 mLs (1 enema total) rectally once for 1 dose. 133 mL Hudsen Fei V, PA-C   docusate sodium (COLACE) 250 MG capsule Take 1 capsule (250 mg total) by mouth daily. 5 capsule Marieelena Bartko V, PA-C   predniSONE (DELTASONE) 50 MG tablet Take 1 tablet (50 mg total) by mouth daily with breakfast. 5 tablet Belinda Fisher, PA-C     PDMP not reviewed this encounter.   Belinda Fisher, PA-C 08/30/19 2307

## 2019-08-30 NOTE — Discharge Instructions (Signed)
Start prednisone as directed. Continue nasal spray. You can continue a few days of ibuprofen/tylenol if needed. Continue to monitor your symptoms, if develop worsening shortness of breath, or have chest pain while exercising, go to the emergency department for further evaluation needed.  Mineral oil enema tonight. Start colace tomorrow for constipation. Keep hydrated, urine should be clear to pale yellow in color. If developing abdominal pain, nausea/vomiting, unable to move bowels, not passing gas, go to the emergency department for further evaluation

## 2019-10-16 ENCOUNTER — Other Ambulatory Visit: Payer: Self-pay | Admitting: Internal Medicine

## 2019-10-16 DIAGNOSIS — Z122 Encounter for screening for malignant neoplasm of respiratory organs: Secondary | ICD-10-CM

## 2019-10-16 DIAGNOSIS — Z136 Encounter for screening for cardiovascular disorders: Secondary | ICD-10-CM

## 2019-10-18 ENCOUNTER — Other Ambulatory Visit: Payer: Self-pay | Admitting: Internal Medicine

## 2019-10-18 DIAGNOSIS — Z122 Encounter for screening for malignant neoplasm of respiratory organs: Secondary | ICD-10-CM

## 2019-10-30 ENCOUNTER — Ambulatory Visit
Admission: RE | Admit: 2019-10-30 | Discharge: 2019-10-30 | Disposition: A | Discharge status: Home / Self Care | Payer: 59 | Source: Ambulatory Visit | Attending: Internal Medicine | Admitting: Internal Medicine

## 2019-10-30 DIAGNOSIS — Z122 Encounter for screening for malignant neoplasm of respiratory organs: Secondary | ICD-10-CM

## 2019-10-30 DIAGNOSIS — Z136 Encounter for screening for cardiovascular disorders: Secondary | ICD-10-CM

## 2019-11-06 ENCOUNTER — Ambulatory Visit
Admission: RE | Admit: 2019-11-06 | Discharge: 2019-11-06 | Disposition: A | Discharge status: Home / Self Care | Payer: 59 | Source: Ambulatory Visit | Attending: Internal Medicine | Admitting: Internal Medicine

## 2019-11-06 DIAGNOSIS — Z122 Encounter for screening for malignant neoplasm of respiratory organs: Secondary | ICD-10-CM

## 2020-05-05 ENCOUNTER — Ambulatory Visit (INDEPENDENT_AMBULATORY_CARE_PROVIDER_SITE_OTHER): Payer: 59

## 2020-05-05 ENCOUNTER — Ambulatory Visit (INDEPENDENT_AMBULATORY_CARE_PROVIDER_SITE_OTHER): Payer: 59 | Admitting: Physician Assistant

## 2020-05-05 ENCOUNTER — Encounter: Payer: Self-pay | Admitting: Orthopedic Surgery

## 2020-05-05 VITALS — Ht 72.0 in | Wt 205.0 lb

## 2020-05-05 DIAGNOSIS — M545 Low back pain, unspecified: Secondary | ICD-10-CM

## 2020-05-05 DIAGNOSIS — M25561 Pain in right knee: Secondary | ICD-10-CM | POA: Diagnosis not present

## 2020-05-05 MED ORDER — PREDNISONE 10 MG PO TABS: 20.0000 mg | ORAL_TABLET | Freq: Every day | ORAL | 1 refills | Status: DC

## 2020-05-05 NOTE — Progress Notes (Signed)
Office Visit Note   Patient: Seth Cox           Date of Birth: 1950/09/22           MRN: [id number] Visit Date: 05/05/2020              Requested by: [name], MD [address] [place],  [place] [address] PCP: [name], MD  Chief Complaint  Patient presents with  . Right Knee - Pain  . Left Knee - Pain  . Lower Back - Pain      HPI: Patient is a pleasant active 69 year old gentleman with a chief complaint of right knee pain and right lower back pain.  He denies any injuries or changes in activity.  The pain can awaken him at night.  He is unsure if they are related.  Denies any paresthesias or weakness although occasionally he feels like his knee wants to give way  Assessment & Plan: Visit Diagnoses:  1. Acute pain of right knee   2. Acute right-sided low back pain, unspecified whether sciatica present     Plan: Discussed the natural history of this problem.  I would like for him to try a course of 20 mg of prednisone daily with breakfast.  He is to stop his Celebrex at this time.  If he gets good relief he may cut down to 1 pill.  If this does not relieve some of his knee symptoms we would recommend a steroid injection into the knee at his next visit.  If it does not improve his back I would recommend an MRI  Follow-Up Instructions: No follow-ups on file.   Ortho Exam  Patient is alert, oriented, no adenopathy, well-dressed, normal affect, normal respiratory effort right knee: No effusion no swelling minimal tenderness over the medial more than lateral joint line no pain with terminal extension and flexion.  Good endpoint on Lachman testing. Lumbar spine: He has no palpable deformity.  He has pain that goes from his back into his buttock especially with lateral bending away from the right side.  No pain with extension flexion good dorsiflexion plantarflexion strength no paresthesias  Imaging: No results found. No images are attached  to the encounter.  Labs: No results found for: HGBA1C, ESRSEDRATE, CRP, LABURIC, REPTSTATUS, GRAMSTAIN, CULT, LABORGA   No results found for: ALBUMIN, PREALBUMIN, LABURIC  No results found for: MG No results found for: VD25OH  No results found for: PREALBUMIN CBC EXTENDED Latest Ref Rng & Units 01/09/2010  HGB 13.0 - 17.0 g/dL 37.3  HCT 42.8 - 76.8 % 50.0     Body mass index is 27.8 kg/m.  Orders:  Orders Placed This Encounter  Procedures  . XR KNEE 3 VIEW RIGHT  . XR Lumbar Spine 2-3 Views   No orders of the defined types were placed in this encounter.    Procedures: No procedures performed  Clinical Data: No additional findings.  ROS:  All other systems negative, except as noted in the HPI. Review of Systems  Objective: Vital Signs: Ht 6' (1.829 m)   Wt 205 lb (93 kg)   BMI 27.80 kg/m   Specialty Comments:  No specialty comments available.  PMFS History: Patient Active Problem List   Diagnosis Date Noted  . Acquired thrombocytopenia ([facility]) 10/01/2018  . Prediabetes 10/01/2018  . Plantar fasciitis 07/15/2018  . Chest pain with moderate risk for cardiac etiology 07/11/2017  . Sinus bradycardia 07/11/2017  . Elevated blood  pressure reading without diagnosis of hypertension   . History of BPH    Past Medical History:  Diagnosis Date  . Elevated heart rate with elevated blood pressure without diagnosis of hypertension   . History of BPH     Family History  Problem Relation Age of Onset  . Fibromyalgia Mother   . Healthy Sister   . Hypertension Brother   . Heart failure Sister        Died at age 75.  Had some type of cardiomyopathy and was pending transplant    Past Surgical History:  Procedure Laterality Date  . MANDIBLE FRACTURE SURGERY     Social History   Occupational History  . 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 Smoking status: Former Smoker    Packs/day:  1.00    Types: Cigarettes  . Smokeless tobacco: Never Used  Substance and Sexual Activity  . Alcohol use: Yes    Comment: occasional  . Drug use: No  . Sexual activity: Yes    Birth control/protection: Condom

## 2020-05-26 ENCOUNTER — Encounter: Payer: Self-pay | Admitting: Orthopedic Surgery

## 2020-05-26 ENCOUNTER — Ambulatory Visit (INDEPENDENT_AMBULATORY_CARE_PROVIDER_SITE_OTHER): Payer: 59 | Admitting: Orthopedic Surgery

## 2020-05-26 DIAGNOSIS — M545 Low back pain, unspecified: Secondary | ICD-10-CM

## 2020-05-26 DIAGNOSIS — M25561 Pain in right knee: Secondary | ICD-10-CM

## 2020-05-26 MED ORDER — LIDOCAINE HCL 1 % IJ SOLN
5.0000 mL | INTRAMUSCULAR | Status: AC | PRN
  Administered 2020-05-26: 5 mL

## 2020-05-26 MED ORDER — METHYLPREDNISOLONE ACETATE 40 MG/ML IJ SUSP
40.0000 mg | INTRAMUSCULAR | Status: AC | PRN
  Administered 2020-05-26: 40 mg via INTRA_ARTICULAR

## 2020-05-26 NOTE — Progress Notes (Signed)
Office Visit Note   Patient: Seth Cox           Date of Birth: 09/20/1950           MRN: [id number] Visit Date: 05/26/2020              Requested by: [name], MD [address] [place],  [place] [address] PCP: [name], MD  Chief Complaint  Patient presents with  . Right Knee - Follow-up      HPI: And is a pleasant 70 year old gentleman who follows up for his right knee pain and lower back pain.  He was placed on a course of oral prednisone because he did not want to pursue an injection in his knee.  He does say the prednisone has helped his back and his knee a little bit but not to the extent that he found it very helpful.  He is interested in a knee injection today  Assessment & Plan: Visit Diagnoses: No diagnosis found.  Plan: Right knee was injected today.  We will order an MRI.  He will follow-up with Dr. Lajoyce Corners once the MRI is complete and also discussed the results of the injection on his knee  Follow-Up Instructions: No follow-ups on file.   Ortho Exam  Patient is alert, oriented, no adenopathy, well-dressed, normal affect, normal respiratory effort. Right knee: No effusion no redness tenderness over the lateral joint line.  Compartments are soft and nontender.  Lower back right-sided back pain has not changed since previous visit no increasing paresthesias or radiculopathy still tender he continues to have pain on his right side into his right buttock especially with lateral bending away from the right side no pain with extension flexion good strength with dorsiflexion and plantarflexion  Imaging: No results found. No images are attached to the encounter.  Labs: No results found for: HGBA1C, ESRSEDRATE, CRP, LABURIC, REPTSTATUS, GRAMSTAIN, CULT, LABORGA   No results found for: ALBUMIN, PREALBUMIN, LABURIC  No results found for: MG No results found for: VD25OH  No results found for: PREALBUMIN CBC EXTENDED Latest Ref Rng  & Units 01/09/2010  HGB 13.0 - 17.0 g/dL 72.5  HCT 36.6 - 44.0 % 50.0     There is no height or weight on file to calculate BMI.  Orders:  No orders of the defined types were placed in this encounter.  No orders of the defined types were placed in this encounter.    Procedures: Large Joint Inj: R knee on 05/26/2020 8:31 AM Indications: pain and diagnostic evaluation Details: 22 G 1.5 in needle, anteromedial approach  Arthrogram: No  Medications: 40 mg methylPREDNISolone acetate 40 MG/ML; 5 mL lidocaine 1 % Outcome: tolerated well, no immediate complications Procedure, treatment alternatives, risks and benefits explained, specific risks discussed. Consent was given by the patient.      Clinical Data: No additional findings.  ROS:  All other systems negative, except as noted in the HPI. Review of Systems  Objective: Vital Signs: There were no vitals taken for this visit.  Specialty Comments:  No specialty comments available.  PMFS History: Patient Active Problem List   Diagnosis Date Noted  . Acquired thrombocytopenia ([facility]) 10/01/2018  . Prediabetes 10/01/2018  . Plantar fasciitis 07/15/2018  . Chest pain with moderate risk for cardiac etiology 07/11/2017  . Sinus bradycardia 07/11/2017  . Elevated blood pressure reading without diagnosis of hypertension   . History of BPH    Past Medical History:  Diagnosis  Date  . Elevated heart rate with elevated blood pressure without diagnosis of hypertension   . History of BPH     Family History  Problem Relation Age of Onset  . Fibromyalgia Mother   . Healthy Sister   . Hypertension Brother   . Heart failure Sister        Died at age 47.  Had some type of cardiomyopathy and was pending transplant    Past Surgical History:  Procedure Laterality Date  . MANDIBLE FRACTURE SURGERY     Social History   Occupational History  . 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 Smoking status: Former Smoker    Packs/day: 1.00    Types: Cigarettes  . Smokeless tobacco: Never Used  Substance and Sexual Activity  . Alcohol use: Yes    Comment: occasional  . Drug use: No  . Sexual activity: Yes    Birth control/protection: Condom

## 2020-05-29 ENCOUNTER — Ambulatory Visit
Admission: RE | Admit: 2020-05-29 | Discharge: 2020-05-29 | Disposition: A | Discharge status: Home / Self Care | Payer: 59 | Source: Ambulatory Visit | Attending: Physician Assistant | Admitting: Physician Assistant

## 2020-05-29 ENCOUNTER — Other Ambulatory Visit: Payer: Self-pay

## 2020-05-29 DIAGNOSIS — M545 Low back pain, unspecified: Secondary | ICD-10-CM

## 2020-06-19 ENCOUNTER — Ambulatory Visit: Payer: 59 | Admitting: Orthopedic Surgery

## 2020-07-01 ENCOUNTER — Ambulatory Visit (INDEPENDENT_AMBULATORY_CARE_PROVIDER_SITE_OTHER): Payer: 59 | Admitting: Physician Assistant

## 2020-07-01 ENCOUNTER — Encounter: Payer: Self-pay | Admitting: Physician Assistant

## 2020-07-01 DIAGNOSIS — M25561 Pain in right knee: Secondary | ICD-10-CM | POA: Diagnosis not present

## 2020-07-01 DIAGNOSIS — M545 Low back pain, unspecified: Secondary | ICD-10-CM

## 2020-07-01 NOTE — Progress Notes (Signed)
Office Visit Note   Patient: Seth Cox           Date of Birth: September 03, 1950           MRN: [id number] Visit Date: 07/01/2020              Requested by: [name], MD [address] [place],  [place] [address] PCP: [name], MD  No chief complaint on file.     HPI: Patient is a pleasant 70 year old gentleman who is here to follow-up on his right knee and his right lower back.  He has had a trial of prednisone on his back and it did not help him very much.  He did have an injection of his right knee at the last visit which helped him but the pain is starting to return.  He is here to review the MRI of his lumbar spine and discuss the next step for his knee  Assessment & Plan: Visit Diagnoses:  1. Acute right-sided low back pain, unspecified whether sciatica present   2. Acute pain of right knee     Plan: Have given a referral to physiatry to Dr. Alvester Morin for epidural steroid injection.  With regards to his knee I have offered him 1 more injection today.  He would like to go forward with an MRI of his knee.  Should follow-up with Dr. Lajoyce Corners once this is completed Follow-Up Instructions: No follow-ups on file.   Ortho Exam  Patient is alert, oriented, no adenopathy, well-dressed, normal affect, normal respiratory effort.  Right knee he has no effusion no tenderness on the medial joint line he does have some tenderness on the lateral joint.  No pain with terminal extension or flexion Lumbar spine no palpable deformity he has pain that goes from his back into his buttock especially with lateral bending away from the right side no pain with extension flexion good dorsiflexion plantarflexion strength sensation is intact.  MRI reviewed which did show some disc desiccation at L5-S1 and L4-5.  He does have a subtle anterior listhesis at L4-5 and L5-S1   Imaging: No results found. No images are attached to the encounter.  Labs: No results found for:  HGBA1C, ESRSEDRATE, CRP, LABURIC, REPTSTATUS, GRAMSTAIN, CULT, LABORGA   No results found for: ALBUMIN, PREALBUMIN, LABURIC  No results found for: MG No results found for: VD25OH  No results found for: PREALBUMIN CBC EXTENDED Latest Ref Rng & Units 01/09/2010  HGB 13.0 - 17.0 g/dL 82.4  HCT 23.5 - 36.1 % 50.0     There is no height or weight on file to calculate BMI.  Orders:  Orders Placed This Encounter  Procedures  . MR Knee Right w/o contrast  . Ambulatory referral to Physical Medicine Rehab   No orders of the defined types were placed in this encounter.    Procedures: No procedures performed  Clinical Data: No additional findings.  ROS:  All other systems negative, except as noted in the HPI. Review of Systems  Objective: Vital Signs: There were no vitals taken for this visit.  Specialty Comments:  No specialty comments available.  PMFS History: Patient Active Problem List   Diagnosis Date Noted  . Acquired thrombocytopenia (HCC) 10/01/2018  . Prediabetes 10/01/2018  . Plantar fasciitis 07/15/2018  . Chest pain with moderate risk for cardiac etiology 07/11/2017  . Sinus bradycardia 07/11/2017  . Elevated blood pressure reading without diagnosis of hypertension   . History of BPH  Past Medical History:  Diagnosis Date  . Elevated heart rate with elevated blood pressure without diagnosis of hypertension   . History of BPH     Family History  Problem Relation Age of Onset  . Fibromyalgia Mother   . Healthy Sister   . Hypertension Brother   . Heart failure Sister        Died at age 89.  Had some type of cardiomyopathy and was pending transplant    Past Surgical History:  Procedure Laterality Date  . MANDIBLE FRACTURE SURGERY     Social History   Occupational History  . 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 Smoking status: Former Smoker    Packs/day: 1.00    Types:  Cigarettes  . Smokeless tobacco: Never Used  Substance and Sexual Activity  . Alcohol use: Yes    Comment: occasional  . Drug use: No  . Sexual activity: Yes    Birth control/protection: Condom

## 2020-07-17 ENCOUNTER — Ambulatory Visit (INDEPENDENT_AMBULATORY_CARE_PROVIDER_SITE_OTHER): Payer: 59 | Admitting: Physical Medicine and Rehabilitation

## 2020-07-17 ENCOUNTER — Ambulatory Visit: Payer: Self-pay

## 2020-07-17 ENCOUNTER — Encounter: Payer: Self-pay | Admitting: Physical Medicine and Rehabilitation

## 2020-07-17 VITALS — BP 160/110 | HR 67

## 2020-07-17 DIAGNOSIS — M5416 Radiculopathy, lumbar region: Secondary | ICD-10-CM | POA: Diagnosis not present

## 2020-07-17 MED ORDER — DEXAMETHASONE SODIUM PHOSPHATE 10 MG/ML IJ SOLN: 15.0000 mg | Freq: Once | INTRAMUSCULAR | Status: DC

## 2020-07-17 NOTE — Patient Instructions (Signed)

## 2020-07-17 NOTE — Procedures (Signed)
Lumbosacral Transforaminal Epidural Steroid Injection - Sub-Pedicular Approach with Fluoroscopic Guidance  Patient: Seth Cox      Date of Birth: 1951-01-21 MRN: [id number] PCP: [name], MD      Visit Date: 07/17/2020   Universal Protocol:    Date/Time: 07/17/2020  Consent Given By: the patient  Position: PRONE  Additional Comments: Vital signs were monitored before and after the procedure. Patient was prepped and draped in the usual sterile fashion. The correct patient, procedure, and site was verified.   Injection Procedure Details:   Procedure diagnoses: Lumbar radiculopathy [M54.16]    Meds Administered:  Meds ordered this encounter  Medications  . dexamethasone (DECADRON) injection 15 mg    Laterality: Right  Location/Site:  L4-L5  Needle:5.0 in., 22 ga.  Short bevel or Quincke spinal needle  Needle Placement: Transforaminal  Findings:    -Comments: Excellent flow of contrast along the nerve, nerve root and into the epidural space.  Procedure Details: After squaring off the end-plates to get a true AP view, the C-arm was positioned so that an oblique view of the foramen as noted above was visualized. The target area is just inferior to the "nose of the scotty dog" or sub pedicular. The soft tissues overlying this structure were infiltrated with 2-3 ml. of 1% Lidocaine without Epinephrine.  The spinal needle was inserted toward the target using a "trajectory" view along the fluoroscope beam.  Under AP and lateral visualization, the needle was advanced so it did not puncture dura and was located close the 6 O'Clock position of the pedical in AP tracterory. Biplanar projections were used to confirm position. Aspiration was confirmed to be negative for CSF and/or blood. A 1-2 ml. volume of Isovue-250 was injected and flow of contrast was noted at each level. Radiographs were obtained for documentation purposes.   After attaining the desired flow of  contrast documented above, a 0.5 to 1.0 ml test dose of 0.25% Marcaine was injected into each respective transforaminal space.  The patient was observed for 90 seconds post injection.  After no sensory deficits were reported, and normal lower extremity motor function was noted,   the above injectate was administered so that equal amounts of the injectate were placed at each foramen (level) into the transforaminal epidural space.   Additional Comments:  The patient tolerated the procedure well Dressing: 2 x 2 sterile gauze and Band-Aid    Post-procedure details: Patient was observed during the procedure. Post-procedure instructions were reviewed.  Patient left the clinic in stable condition.

## 2020-07-17 NOTE — Progress Notes (Signed)
Seth Cox - 70 y.o. male MRN [id number]  Date of birth: 07/16/1950  Office Visit Note: Visit Date: 07/17/2020 PCP: [name], MD Referred by: [name], MD  Subjective: Chief Complaint  Patient presents with  . Lower Back - Pain  . Right Hip - Pain   HPI:  Seth Cox is a 70 y.o. male who comes in today at the request of [place] Persons, PA-C for planned Right L4-L5 Lumbar epidural steroid injection with fluoroscopic guidance.  The patient has failed conservative care including home exercise, medications, time and activity modification.  This injection will be diagnostic and hopefully therapeutic.  Please see requesting physician notes for further details and justification. MRI reviewed with images and spine model.  MRI reviewed in the note below.  He is mainly having right low back pain and right knee pain separately not some much radiating down to the knee.  He clearly has some issues with facet arthropathy at L4-5 some foraminal narrowing.  Depending on relief with injection would look at facet joint block for his back if needed.  MRI scheduled for his knee will return to see Dr. Lajoyce Corners for that.  ROS Otherwise per HPI.  Assessment & Plan: Visit Diagnoses:    ICD-10-CM   1. Lumbar radiculopathy  M54.16 XR C-ARM NO REPORT    Epidural Steroid injection    dexamethasone (DECADRON) injection 15 mg    Plan: No additional findings.   Meds & Orders:  Meds ordered this encounter  Medications  . dexamethasone (DECADRON) injection 15 mg    Orders Placed This Encounter  Procedures  . XR C-ARM NO REPORT  . Epidural Steroid injection    Follow-up: Return if symptoms worsen or fail to improve.   Procedures: No procedures performed  Lumbosacral Transforaminal Epidural Steroid Injection - Sub-Pedicular Approach with Fluoroscopic Guidance  Patient: Seth Cox      Date of Birth: [birth year] MRN: [id number] PCP: [name], MD      Visit  Date: 07/17/2020   Universal Protocol:    Date/Time: 07/17/2020  Consent Given By: the patient  Position: PRONE  Additional Comments: Vital signs were monitored before and after the procedure. Patient was prepped and draped in the usual sterile fashion. The correct patient, procedure, and site was verified.   Injection Procedure Details:   Procedure diagnoses: Lumbar radiculopathy [M54.16]    Meds Administered:  Meds ordered this encounter  Medications  . dexamethasone (DECADRON) injection 15 mg    Laterality: Right  Location/Site:  L4-L5  Needle:5.0 in., 22 ga.  Short bevel or Quincke spinal needle  Needle Placement: Transforaminal  Findings:    -Comments: Excellent flow of contrast along the nerve, nerve root and into the epidural space.  Procedure Details: After squaring off the end-plates to get a true AP view, the C-arm was positioned so that an oblique view of the foramen as noted above was visualized. The target area is just inferior to the "nose of the scotty dog" or sub pedicular. The soft tissues overlying this structure were infiltrated with 2-3 ml. of 1% Lidocaine without Epinephrine.  The spinal needle was inserted toward the target using a "trajectory" view along the fluoroscope beam.  Under AP and lateral visualization, the needle was advanced so it did not puncture dura and was located close the 6 O'Clock position of the pedical in AP tracterory. Biplanar projections were used to confirm position. Aspiration was confirmed to be negative for CSF and/or  blood. A 1-2 ml. volume of Isovue-250 was injected and flow of contrast was noted at each level. Radiographs were obtained for documentation purposes.   After attaining the desired flow of contrast documented above, a 0.5 to 1.0 ml test dose of 0.25% Marcaine was injected into each respective transforaminal space.  The patient was observed for 90 seconds post injection.  After no sensory deficits were reported,  and normal lower extremity motor function was noted,   the above injectate was administered so that equal amounts of the injectate were placed at each foramen (level) into the transforaminal epidural space.   Additional Comments:  The patient tolerated the procedure well Dressing: 2 x 2 sterile gauze and Band-Aid    Post-procedure details: Patient was observed during the procedure. Post-procedure instructions were reviewed.  Patient left the clinic in stable condition.       Clinical History: MRI LUMBAR SPINE WITHOUT CONTRAST  TECHNIQUE: Multiplanar, multisequence MR imaging of the lumbar spine was performed. No intravenous contrast was administered.  COMPARISON:  Lumbar radiographs 05/05/2020.  Chest radiographs 1017.  FINDINGS: Segmentation: 12 full size ribs in 2007. Therefore hypoplastic ribs are designated at L1 along with a mostly lumbarized S1 segment. Correlation with radiographs is recommended prior to any operative intervention.  Alignment: Straightening of lumbar lordosis with subtle anterolisthesis at both L4-L5 and L5-S1.  Vertebrae: No marrow edema or evidence of acute osseous abnormality. Visualized bone marrow signal is within normal limits. Intact visible sacrum and SI joints.  Conus medullaris and cauda equina: Conus extends to the T12-L1 level. No lower spinal cord or conus signal abnormality.  Paraspinal and other soft tissues: Negative.  Disc levels:  T12-L1:  Negative.  L1-L2:  Negative.  L2-L3:  Subtle disc bulging.  No stenosis.  L3-L4: Mild circumferential disc bulge eccentric to the left. Mild facet and moderate ligament flavum hypertrophy. Borderline to mild spinal stenosis, left L3 foraminal stenosis.  L4-L5: Disc desiccation. Circumferential disc bulge. Moderate facet and ligament flavum hypertrophy. Mild spinal and bilateral lateral recess stenosis (L5 nerve levels). Endplate spurring contributes to moderate  bilateral L4 foraminal stenosis.  L5-S1: Disc desiccation, circumferential disc bulge and endplate spurring. Small left subarticular annular fissure of the disc (series 3, image 10). Moderate facet hypertrophy. No spinal stenosis. Mild bilateral lateral recess stenosis (S1 nerve levels). Mild bilateral L5 foraminal stenosis.  S1-S2: Mostly lumbarized, negative.  IMPRESSION: 1. 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 Correlation with radiographs is recommended prior to any operative intervention.  2. Subtle anterolisthesis at both L4-L5 and L5-S1 with disc bulging and moderate posterior element hypertrophy. Mild spinal stenosis at the former. Mild bilateral lateral recess stenosis at both levels. Moderate bilateral L4 neural foraminal stenosis.  3. Similar multifactorial borderline to mild spinal and foraminal stenosis at L3-L4.   Electronically Signed   By: Odessa Fleming M.D.   On: 05/29/2020 07:44     Objective:  VS:  HT:    WT:   BMI:     BP:(!) 160/110  HR:67bpm  TEMP: ( )  RESP:  Physical Exam Vitals and nursing note reviewed.  Constitutional:      General: He is not in acute distress.    Appearance: Normal appearance. He is not ill-appearing.  HENT:     Head: Normocephalic and atraumatic.     Right Ear: External ear normal.     Left Ear: External ear normal.     Nose: No congestion.  Eyes:  Extraocular Movements: Extraocular movements intact.  Cardiovascular:     Rate and Rhythm: Normal rate.     Pulses: Normal pulses.  Pulmonary:     Effort: Pulmonary effort is normal. No respiratory distress.  Abdominal:     General: There is no distension.     Palpations: Abdomen is soft.  Musculoskeletal:        General: No tenderness or signs of injury.     Cervical back: Neck supple.     Right lower leg: No edema.     Left lower leg: No edema.     Comments: Patient has good distal strength without  clonus.  Skin:    Findings: No erythema or rash.  Neurological:     General: No focal deficit present.     Mental Status: He is alert and oriented to person, place, and time.     Sensory: No sensory deficit.     Motor: No weakness or abnormal muscle tone.     Coordination: Coordination normal.  Psychiatric:        Mood and Affect: Mood normal.        Behavior: Behavior normal.      Imaging: No results found.

## 2020-07-17 NOTE — Progress Notes (Signed)
Pt state lower back pain that travels down his right hip to his knee. Pt state walking, standing and sitting makes the pain worse. Pt state he takes over the counter meds to help ease the pain.  Numeric Pain Rating Scale and Functional Assessment Average Pain 7   In the last MONTH (on 0-10 scale) has pain interfered with the following?  1. General activity like being  able to carry out your everyday physical activities such as walking, climbing stairs, carrying groceries, or moving a chair?  Rating(10)   +Driver, -BT, -Dye Allergies.

## 2020-07-19 ENCOUNTER — Other Ambulatory Visit: Payer: 59

## 2020-08-09 ENCOUNTER — Other Ambulatory Visit: Payer: Self-pay

## 2020-08-09 ENCOUNTER — Ambulatory Visit
Admission: RE | Admit: 2020-08-09 | Discharge: 2020-08-09 | Disposition: A | Discharge status: Home / Self Care | Payer: 59 | Source: Ambulatory Visit | Attending: Physician Assistant | Admitting: Physician Assistant

## 2020-08-09 DIAGNOSIS — M545 Low back pain, unspecified: Secondary | ICD-10-CM

## 2020-08-22 ENCOUNTER — Ambulatory Visit (INDEPENDENT_AMBULATORY_CARE_PROVIDER_SITE_OTHER): Payer: 59 | Admitting: Physician Assistant

## 2020-08-22 ENCOUNTER — Encounter: Payer: Self-pay | Admitting: Physician Assistant

## 2020-08-22 DIAGNOSIS — M25561 Pain in right knee: Secondary | ICD-10-CM

## 2020-08-22 NOTE — Progress Notes (Signed)
Office Visit Note   Patient: Seth Cox           Date of Birth: 1950-10-13           MRN: [id number] Visit Date: 08/22/2020              Requested by: Seth Forest, MD [address] Seth [place],  [place] [address] PCP: Seth Forest, MD  Chief Complaint  Patient presents with  . 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  I have been following him for right lateral knee pain.  This has been going on for a while he cannot remember a particular injury.  I did inject his knee which did help him significantly but was only for a short period of time he has difficulty climbing in and out of his truck because of the pain.  I did order an MRI and is here to review it today  Assessment & Plan: Visit Diagnoses: No diagnosis found.  Plan: After discussion of the MRI patient would like to go forward with a right knee arthroscopy lateral meniscectomy.  I discussed the risks of the procedure outcomes and rehabilitation.  He is fine going forward with surgery and will meet with Dr. Lajoyce Cox on the day of surgery certainly if he has any questions before then I encouraged him to call or come in  Follow-Up Instructions: No follow-ups on file.   Ortho Exam  Patient is alert, oriented, no adenopathy, well-dressed, normal affect, normal respiratory effort Right knee no effusion no cellulitis he is focally tender over the lateral joint line.  No tenderness over the medial joint line good ligamentous stability.  MRI was reviewed today.  He does have a high-grade partial-thickness cartilage tear over the medial compartment but he is not tender in this area.  Lateral compartment demonstrates findings consistent with complex lateral meniscus tear.  Heart regular rate and rhythm lungs clear  Imaging: No results found. No images are attached to the encounter.  Labs: No results found for: HGBA1C, ESRSEDRATE, CRP, LABURIC,  REPTSTATUS, GRAMSTAIN, CULT, LABORGA   No results found for: ALBUMIN, PREALBUMIN, CBC  No results found for: MG No results found for: VD25OH  No results found for: PREALBUMIN CBC EXTENDED Latest Ref Rng & Units 01/09/2010  HGB 13.0 - 17.0 g/dL 87.5  HCT 64.3 - 32.9 % 50.0     There is no height or weight on file to calculate BMI.  Orders:  No orders of the defined types were placed in this encounter.  No orders of the defined types were placed in this encounter.    Procedures: No procedures performed  Clinical Data: No additional findings.  ROS:  All other systems negative, except as noted in the HPI. Review of Systems  Objective: Vital Signs: There were no vitals taken for this visit.  Specialty Comments:  No specialty comments available.  PMFS History: Patient Active Problem List   Diagnosis Date Noted  . Acquired thrombocytopenia (HCC) 10/01/2018  . Prediabetes 10/01/2018  . Plantar fasciitis 07/15/2018  . Chest pain with moderate risk for cardiac etiology 07/11/2017  . Sinus bradycardia 07/11/2017  . Elevated blood pressure reading without diagnosis of hypertension   . History of BPH    Past Medical History:  Diagnosis Date  . Elevated heart rate with elevated blood pressure without diagnosis of hypertension   . History of BPH  Family History  Problem Relation Age of Onset  . Fibromyalgia Mother   . Healthy Sister   . Hypertension Brother   . Heart failure Sister        Died at age 68.  Had some type of cardiomyopathy and was pending transplant    Past Surgical History:  Procedure Laterality Date  . MANDIBLE FRACTURE SURGERY     Social History   Occupational History  . 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 Smoking status: Former Smoker    Packs/day: 1.00    Types: Cigarettes  . Smokeless tobacco: Never Used  Substance and Sexual Activity  . Alcohol use: Yes     Comment: occasional  . Drug use: No  . Sexual activity: Yes    Birth control/protection: Condom

## 2020-09-10 ENCOUNTER — Telehealth: Payer: Self-pay | Admitting: Orthopedic Surgery

## 2020-09-10 NOTE — Telephone Encounter (Signed)
Pt came in and wanted to know when he can be expected to get scheduled for surgery? He states it was mentioned at his last appt on 08/22/20 but then no one followed up with him. The pt has to leave town tomorrow so he would like a CB today if possible to give him a update. He states he will be free to talk anytime today.   [phone]

## 2020-09-29 ENCOUNTER — Other Ambulatory Visit: Payer: Self-pay

## 2020-09-30 ENCOUNTER — Other Ambulatory Visit: Payer: Self-pay | Admitting: Physician Assistant

## 2020-10-06 ENCOUNTER — Other Ambulatory Visit: Payer: Self-pay

## 2020-10-06 ENCOUNTER — Encounter (HOSPITAL_BASED_OUTPATIENT_CLINIC_OR_DEPARTMENT_OTHER): Payer: Self-pay | Admitting: Orthopedic Surgery

## 2020-10-10 NOTE — Progress Notes (Signed)

## 2020-10-14 ENCOUNTER — Other Ambulatory Visit: Payer: Self-pay

## 2020-10-14 ENCOUNTER — Ambulatory Visit (HOSPITAL_BASED_OUTPATIENT_CLINIC_OR_DEPARTMENT_OTHER): Payer: 59 | Admitting: Certified Registered"

## 2020-10-14 ENCOUNTER — Encounter (HOSPITAL_BASED_OUTPATIENT_CLINIC_OR_DEPARTMENT_OTHER): Payer: Self-pay | Admitting: Orthopedic Surgery

## 2020-10-14 ENCOUNTER — Encounter (HOSPITAL_BASED_OUTPATIENT_CLINIC_OR_DEPARTMENT_OTHER)
Admission: RE | Disposition: A | Discharge status: Home / Self Care | Payer: Self-pay | Source: Home / Self Care | Attending: Orthopedic Surgery

## 2020-10-14 ENCOUNTER — Ambulatory Visit (HOSPITAL_BASED_OUTPATIENT_CLINIC_OR_DEPARTMENT_OTHER)
Admission: RE | Admit: 2020-10-14 | Discharge: 2020-10-14 | Disposition: A | Discharge status: Home / Self Care | Payer: 59 | Attending: Orthopedic Surgery | Admitting: Orthopedic Surgery

## 2020-10-14 DIAGNOSIS — M23200 Derangement of unspecified lateral meniscus due to old tear or injury, right knee: Secondary | ICD-10-CM | POA: Diagnosis not present

## 2020-10-14 DIAGNOSIS — Z87891 Personal history of nicotine dependence: Secondary | ICD-10-CM | POA: Diagnosis not present

## 2020-10-14 DIAGNOSIS — X58XXXA Exposure to other specified factors, initial encounter: Secondary | ICD-10-CM | POA: Diagnosis not present

## 2020-10-14 DIAGNOSIS — R030 Elevated blood-pressure reading, without diagnosis of hypertension: Secondary | ICD-10-CM | POA: Insufficient documentation

## 2020-10-14 DIAGNOSIS — M94261 Chondromalacia, right knee: Secondary | ICD-10-CM

## 2020-10-14 DIAGNOSIS — Z87448 Personal history of other diseases of urinary system: Secondary | ICD-10-CM | POA: Insufficient documentation

## 2020-10-14 DIAGNOSIS — S83281A Other tear of lateral meniscus, current injury, right knee, initial encounter: Secondary | ICD-10-CM | POA: Diagnosis present

## 2020-10-14 DIAGNOSIS — Z8249 Family history of ischemic heart disease and other diseases of the circulatory system: Secondary | ICD-10-CM | POA: Insufficient documentation

## 2020-10-14 HISTORY — PX: CHONDROPLASTY: SHX5177

## 2020-10-14 HISTORY — PX: KNEE ARTHROSCOPY WITH LATERAL MENISECTOMY: SHX6193

## 2020-10-14 SURGERY — ARTHROSCOPY, KNEE, WITH LATERAL MENISCECTOMY
Anesthesia: General | Site: Knee | Laterality: Right

## 2020-10-14 MED ORDER — PROMETHAZINE HCL 25 MG/ML IJ SOLN: 6.2500 mg | INTRAMUSCULAR | Status: DC | PRN

## 2020-10-14 MED ORDER — DEXAMETHASONE SODIUM PHOSPHATE 10 MG/ML IJ SOLN
INTRAMUSCULAR | Status: AC
  Filled 2020-10-14: qty 1

## 2020-10-14 MED ORDER — LIDOCAINE HCL (CARDIAC) PF 100 MG/5ML IV SOSY
PREFILLED_SYRINGE | INTRAVENOUS | Status: DC | PRN
  Administered 2020-10-14: 80 mg via INTRAVENOUS

## 2020-10-14 MED ORDER — MIDAZOLAM HCL 2 MG/2ML IJ SOLN
INTRAMUSCULAR | Status: AC
  Filled 2020-10-14: qty 2

## 2020-10-14 MED ORDER — SODIUM CHLORIDE 0.9 % IR SOLN
Status: DC | PRN
  Administered 2020-10-14: 11000 mL

## 2020-10-14 MED ORDER — AMISULPRIDE (ANTIEMETIC) 5 MG/2ML IV SOLN: 10.0000 mg | Freq: Once | INTRAVENOUS | Status: DC | PRN

## 2020-10-14 MED ORDER — CEFAZOLIN SODIUM-DEXTROSE 2-4 GM/100ML-% IV SOLN
2.0000 g | INTRAVENOUS | Status: AC
  Administered 2020-10-14: 2 g via INTRAVENOUS

## 2020-10-14 MED ORDER — OXYCODONE HCL 5 MG/5ML PO SOLN: 5.0000 mg | Freq: Once | ORAL | Status: AC | PRN

## 2020-10-14 MED ORDER — CEFAZOLIN SODIUM-DEXTROSE 2-4 GM/100ML-% IV SOLN
INTRAVENOUS | Status: AC
  Filled 2020-10-14: qty 100

## 2020-10-14 MED ORDER — ONDANSETRON HCL 4 MG/2ML IJ SOLN
INTRAMUSCULAR | Status: AC
  Filled 2020-10-14: qty 2

## 2020-10-14 MED ORDER — PROPOFOL 10 MG/ML IV BOLUS
INTRAVENOUS | Status: DC | PRN
  Administered 2020-10-14: 50 mg via INTRAVENOUS
  Administered 2020-10-14: 150 mg via INTRAVENOUS
  Administered 2020-10-14: 50 mg via INTRAVENOUS

## 2020-10-14 MED ORDER — HYDROMORPHONE HCL 1 MG/ML IJ SOLN: 0.2500 mg | INTRAMUSCULAR | Status: DC | PRN

## 2020-10-14 MED ORDER — ONDANSETRON HCL 4 MG/2ML IJ SOLN
INTRAMUSCULAR | Status: DC | PRN
  Administered 2020-10-14: 4 mg via INTRAVENOUS

## 2020-10-14 MED ORDER — PROPOFOL 10 MG/ML IV BOLUS
INTRAVENOUS | Status: AC
  Filled 2020-10-14: qty 40

## 2020-10-14 MED ORDER — OXYCODONE HCL 5 MG PO TABS
5.0000 mg | ORAL_TABLET | Freq: Once | ORAL | Status: AC | PRN
  Administered 2020-10-14: 5 mg via ORAL

## 2020-10-14 MED ORDER — LACTATED RINGERS IV SOLN: INTRAVENOUS | Status: DC

## 2020-10-14 MED ORDER — DEXAMETHASONE SODIUM PHOSPHATE 10 MG/ML IJ SOLN
INTRAMUSCULAR | Status: DC | PRN
  Administered 2020-10-14: 5 mg via INTRAVENOUS

## 2020-10-14 MED ORDER — EPHEDRINE 5 MG/ML INJ
INTRAVENOUS | Status: AC
  Filled 2020-10-14: qty 20

## 2020-10-14 MED ORDER — BUPIVACAINE HCL (PF) 0.5 % IJ SOLN
INTRAMUSCULAR | Status: DC | PRN
  Administered 2020-10-14: 20 mL via INTRA_ARTICULAR

## 2020-10-14 MED ORDER — LIDOCAINE 2% (20 MG/ML) 5 ML SYRINGE
INTRAMUSCULAR | Status: AC
  Filled 2020-10-14: qty 5

## 2020-10-14 MED ORDER — OXYCODONE HCL 5 MG PO TABS
ORAL_TABLET | ORAL | Status: AC
  Filled 2020-10-14: qty 1

## 2020-10-14 MED ORDER — FENTANYL CITRATE (PF) 100 MCG/2ML IJ SOLN
INTRAMUSCULAR | Status: DC | PRN
  Administered 2020-10-14 (×3): 25 ug via INTRAVENOUS

## 2020-10-14 MED ORDER — FENTANYL CITRATE (PF) 100 MCG/2ML IJ SOLN
INTRAMUSCULAR | Status: AC
  Filled 2020-10-14: qty 2

## 2020-10-14 MED ORDER — OXYCODONE-ACETAMINOPHEN 5-325 MG PO TABS: 1.0000 | ORAL_TABLET | ORAL | 0 refills | Status: DC | PRN

## 2020-10-14 MED ORDER — BUPIVACAINE HCL (PF) 0.5 % IJ SOLN
INTRAMUSCULAR | Status: AC
  Filled 2020-10-14: qty 60

## 2020-10-14 MED ORDER — EPHEDRINE SULFATE 50 MG/ML IJ SOLN
INTRAMUSCULAR | Status: DC | PRN
  Administered 2020-10-14 (×2): 10 mg via INTRAVENOUS

## 2020-10-14 SURGICAL SUPPLY — 30 items
BLADE EXCALIBUR 4.0X13 (MISCELLANEOUS) ×2 IMPLANT
BNDG COHESIVE 6X5 TAN STRL LF (GAUZE/BANDAGES/DRESSINGS) ×2 IMPLANT
COVER WAND RF STERILE (DRAPES) IMPLANT
DISSECTOR 4.0MM X 13CM (MISCELLANEOUS) IMPLANT
DRAPE ARTHROSCOPY W/POUCH 90 (DRAPES) ×2 IMPLANT
DRAPE U-SHAPE 47X51 STRL (DRAPES) ×2 IMPLANT
DRSG EMULSION OIL 3X3 NADH (GAUZE/BANDAGES/DRESSINGS) ×2 IMPLANT
DURAPREP 26ML APPLICATOR (WOUND CARE) ×2 IMPLANT
EXCALIBUR 3.8MM X 13CM (MISCELLANEOUS) IMPLANT
GAUZE SPONGE 4X4 12PLY STRL (GAUZE/BANDAGES/DRESSINGS) ×2 IMPLANT
GLOVE SURG ENC MOIS LTX SZ6.5 (GLOVE) ×2 IMPLANT
GLOVE SURG ORTHO LTX SZ9 (GLOVE) ×2 IMPLANT
GLOVE SURG UNDER POLY LF SZ7 (GLOVE) ×2 IMPLANT
GLOVE SURG UNDER POLY LF SZ9 (GLOVE) ×2 IMPLANT
GOWN STRL REUS W/ TWL LRG LVL3 (GOWN DISPOSABLE) ×1 IMPLANT
GOWN STRL REUS W/ TWL XL LVL3 (GOWN DISPOSABLE) IMPLANT
GOWN STRL REUS W/TWL LRG LVL3 (GOWN DISPOSABLE) ×2
GOWN STRL REUS W/TWL XL LVL3 (GOWN DISPOSABLE) ×2 IMPLANT
IV NS IRRIG 3000ML ARTHROMATIC (IV SOLUTION) ×8 IMPLANT
MANIFOLD NEPTUNE II (INSTRUMENTS) IMPLANT
NDL SAFETY ECLIPSE 18X1.5 (NEEDLE) ×1 IMPLANT
NEEDLE HYPO 18GX1.5 SHARP (NEEDLE) ×2
PACK ARTHROSCOPY DSU (CUSTOM PROCEDURE TRAY) ×2 IMPLANT
PACK BASIN DAY SURGERY FS (CUSTOM PROCEDURE TRAY) ×2 IMPLANT
PORT APPOLLO RF 90DEGREE MULTI (SURGICAL WAND) IMPLANT
PROBE APOLLO 90XL (SURGICAL WAND) IMPLANT
SUT ETHILON 2 0 FSLX (SUTURE) ×2 IMPLANT
TOWEL GREEN STERILE FF (TOWEL DISPOSABLE) ×2 IMPLANT
TUBING ARTHROSCOPY IRRIG 16FT (MISCELLANEOUS) ×2 IMPLANT
WRAP KNEE MAXI GEL POST OP (GAUZE/BANDAGES/DRESSINGS) ×2 IMPLANT

## 2020-10-14 NOTE — Transfer of Care (Signed)
Immediate Anesthesia Transfer of Care Note  Patient: Seth Cox  Procedure(s) Performed: RIGHT KNEE ARTHROSCOPY WITH PARTALATERAL MENISECTOMY (Right Knee) CHONDROPLASTY (Right Knee)  Patient Location: PACU  Anesthesia Type:General  Level of Consciousness: drowsy  Airway & Oxygen Therapy: Patient Spontanous Breathing and Patient connected to face mask oxygen  Post-op Assessment: Report given to RN and Post -op Vital signs reviewed and stable  Post vital signs: Reviewed and stable  Last Vitals:  Vitals Value Taken Time  BP 135/75 10/14/20 0815  Temp    Pulse 51 10/14/20 0816  Resp 10 10/14/20 0816  SpO2 99 % 10/14/20 0816  Vitals shown include unvalidated device data.  Last Pain:  Vitals:   10/14/20 0640  TempSrc: Oral  PainSc: 4       Patients Stated Pain Goal: 3 (10/14/20 0640)  Complications: No complications documented.

## 2020-10-14 NOTE — Discharge Instructions (Signed)
  Post Anesthesia Home Care Instructions  Activity: Get plenty of rest for the remainder of the day. A responsible individual must stay with you for 24 hours following the procedure.  For the next 24 hours, DO NOT: -Drive a car -Operate machinery -Drink alcoholic beverages -Take any medication unless instructed by your physician -Make any legal decisions or sign important papers.  Meals: Start with liquid foods such as gelatin or soup. Progress to regular foods as tolerated. Avoid greasy, spicy, heavy foods. If nausea and/or vomiting occur, drink only clear liquids until the nausea and/or vomiting subsides. Call your physician if vomiting continues.  Special Instructions/Symptoms: Your throat may feel dry or sore from the anesthesia or the breathing tube placed in your throat during surgery. If this causes discomfort, gargle with warm salt water. The discomfort should disappear within 24 hours.  If you had a scopolamine patch placed behind your ear for the management of post- operative nausea and/or vomiting:  1. The medication in the patch is effective for 72 hours, after which it should be removed.  Wrap patch in a tissue and discard in the trash. Wash hands thoroughly with soap and water. 2. You may remove the patch earlier than 72 hours if you experience unpleasant side effects which may include dry mouth, dizziness or visual disturbances. 3. Avoid touching the patch. Wash your hands with soap and water after contact with the patch.    Post Anesthesia Home Care Instructions  Activity: Get plenty of rest for the remainder of the day. A responsible individual must stay with you for 24 hours following the procedure.  For the next 24 hours, DO NOT: -Drive a car -Operate machinery -Drink alcoholic beverages -Take any medication unless instructed by your physician -Make any legal decisions or sign important papers.  Meals: Start with liquid foods such as gelatin or soup. Progress to  regular foods as tolerated. Avoid greasy, spicy, heavy foods. If nausea and/or vomiting occur, drink only clear liquids until the nausea and/or vomiting subsides. Call your physician if vomiting continues.  Special Instructions/Symptoms: Your throat may feel dry or sore from the anesthesia or the breathing tube placed in your throat during surgery. If this causes discomfort, gargle with warm salt water. The discomfort should disappear within 24 hours.  If you had a scopolamine patch placed behind your ear for the management of post- operative nausea and/or vomiting:  1. The medication in the patch is effective for 72 hours, after which it should be removed.  Wrap patch in a tissue and discard in the trash. Wash hands thoroughly with soap and water. 2. You may remove the patch earlier than 72 hours if you experience unpleasant side effects which may include dry mouth, dizziness or visual disturbances. 3. Avoid touching the patch. Wash your hands with soap and water after contact with the patch.    

## 2020-10-14 NOTE — Anesthesia Procedure Notes (Signed)
Procedure Name: LMA Insertion Date/Time: 10/14/2020 7:34 AM Performed by: Lauralyn Primes, CRNA Pre-anesthesia Checklist: Patient identified, Emergency Drugs available, Suction available and Patient being monitored Patient Re-evaluated:Patient Re-evaluated prior to induction Oxygen Delivery Method: Circle system utilized Preoxygenation: Pre-oxygenation with 100% oxygen Induction Type: IV induction Ventilation: Mask ventilation without difficulty LMA: LMA inserted LMA Size: 5.0 Number of attempts: 1 Airway Equipment and Method: Bite block Placement Confirmation: positive ETCO2 Tube secured with: Tape Dental Injury: Teeth and Oropharynx as per pre-operative assessment

## 2020-10-14 NOTE — Interval H&P Note (Signed)
History and Physical Interval Note:  10/14/2020 7:14 AM  Seth Cox  has presented today for surgery, with the diagnosis of Right Knee Lateral Meniscus Tear.  The various methods of treatment have been discussed with the patient and family. After consideration of risks, benefits and other options for treatment, the patient has consented to  Procedure(s): RIGHT KNEE ARTHROSCOPY (Right) as a surgical intervention.  The patient's history has been reviewed, patient examined, no change in status, stable for surgery.  I have reviewed the patient's chart and labs.  Questions were answered to the patient's satisfaction.     Nadara Mustard

## 2020-10-14 NOTE — Anesthesia Postprocedure Evaluation (Signed)
Anesthesia Post Note  Patient: Seth Cox  Procedure(s) Performed: RIGHT KNEE ARTHROSCOPY WITH PARTALATERAL MENISECTOMY (Right Knee) CHONDROPLASTY (Right Knee)     Patient location during evaluation: PACU Anesthesia Type: General Level of consciousness: awake and alert Pain management: pain level controlled Vital Signs Assessment: post-procedure vital signs reviewed and stable Respiratory status: spontaneous breathing, nonlabored ventilation and respiratory function stable Cardiovascular status: blood pressure returned to baseline and stable Postop Assessment: no apparent nausea or vomiting Anesthetic complications: no   No complications documented.  Last Vitals:  Vitals:   10/14/20 0845 10/14/20 0910  BP: (!) 146/84 (!) 145/94  Pulse: (!) 52 (!) 53  Resp: 17 18  Temp:  36.4 C  SpO2: 95% 95%    Last Pain:  Vitals:   10/14/20 0910  TempSrc:   PainSc: 4                  Lowella Curb

## 2020-10-14 NOTE — Op Note (Signed)
10/14/2020  8:15 AM  PATIENT:  Seth Cox    PRE-OPERATIVE DIAGNOSIS:  Right Knee Lateral Meniscus Tear  POST-OPERATIVE DIAGNOSIS:  Same  PROCEDURE:  RIGHT KNEE ARTHROSCOPY WITH PARTALATERAL MENISECTOMY, CHONDROPLASTY  SURGEON:  Nadara Mustard, MD  PHYSICIAN ASSISTANT:None ANESTHESIA:   General  PREOPERATIVE INDICATIONS:  Seth Cox is a  70 y.o. male with a diagnosis of Right Knee Lateral Meniscus Tear who failed conservative measures and elected for surgical management.    The risks benefits and alternatives were discussed with the patient preoperatively including but not limited to the risks of infection, bleeding, nerve injury, cardiopulmonary complications, the need for revision surgery, among others, and the patient was willing to proceed.  OPERATIVE IMPLANTS: None  @ENCIMAGES @  OPERATIVE FINDINGS: Large osteochondral defect with flap tearing of the cartilage of the medial femoral condyle and large degenerative tear of the lateral meniscus.  OPERATIVE PROCEDURE: Patient was brought the operating room and underwent a general anesthetic.  After adequate levels anesthesia were obtained patient's right lower extremity was prepped using DuraPrep draped into a sterile field a timeout was called.  The scope was inserted through the inferior lateral portal and inferior medial working portal was established.  The shaver was first used to perform debridement of the synovitis.  Visualization in the valgus position showed a large flap tear of the articular cartilage of the medial femoral condyle.  The shaver was used to debride this back to a smooth viable margin this left an osteochondral defect approximately 1 cm x 2 cm.  There was good bleeding subchondral bone the margins were stable.  Examination notch showed an intact ACL.  Examination the lateral joint line in the figure-of-four position showed a large complex tear of the lateral meniscus.  Using the biter and shaver this was  debrided back to a stable viable margin.  The articular cartilage was intact.  With the knee in extension there is synovitis anteriorly and in the medial lateral gutters this was debrided a medial plica was also debrided.  A survey of all compartments show there to be no loose bodies.  The instruments were removed the portals were closed using 2-0 nylon sterile dressing was applied patient was extubated taken the PACU in stable condition   DISCHARGE PLANNING:  Antibiotic duration: Preoperative antibiotics  Weightbearing: Weightbearing as tolerated  Pain medication: Prescription for Percocet  Dressing care/ Wound VAC: Discontinue dressing in 2 days  Ambulatory devices: Crutches  Discharge to: Home.  Follow-up: In the office 1 week post operative.

## 2020-10-14 NOTE — H&P (Signed)
Seth Cox is an 70 y.o. male.   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  I have been following him for right lateral knee pain.  This has been going on for a while he cannot remember a particular injury.  I did inject his knee which did help him significantly but was only for a short period of time he has difficulty climbing in and out of his truck because of the pain.  I did order an MRI and is here to review it today  Past Medical History:  Diagnosis Date  . Elevated heart rate with elevated blood pressure without diagnosis of hypertension   . History of BPH     Past Surgical History:  Procedure Laterality Date  . MANDIBLE FRACTURE SURGERY      Family History  Problem Relation Age of Onset  . Fibromyalgia Mother   . Healthy Sister   . Hypertension Brother   . Heart failure Sister        Died at age 67.  Had some type of cardiomyopathy and was pending transplant   Social History:  reports that he has quit smoking. His smoking use included cigarettes. He smoked 1.00 pack per day. He has never used smokeless tobacco. He reports current alcohol use. He reports that he does not use drugs.  Allergies: No Known Allergies  No medications prior to admission.    No results found for this or any previous visit (from the past 48 hour(s)). No results found.  Review of Systems  All other systems reviewed and are negative.   Height 6' (1.829 m), weight 88.5 kg. Physical Exam  normal affect, normal respiratory effort Right knee no effusion no cellulitis he is focally tender over the lateral joint line.  No tenderness over the medial joint line good ligamentous stability.  MRI was reviewed today.  He does have a high-grade partial-thickness cartilage tear over the medial compartment but he is not tender in this area.  Lateral compartment demonstrates findings consistent with complex lateral meniscus tear.  Heart  regular rate and rhythm lungs clear Assessment/Plan Plan: After discussion of the MRI patient would like to go forward with a right knee arthroscopy lateral meniscectomy.  I discussed the risks of the procedure outcomes and rehabilitation.  He is fine going forward with surgery and will meet with Dr. Lajoyce Corners on the day of surgery certainly if he has any questions before then I encouraged him to call or come in  [facility] Mackey Varricchio, PA 10/14/2020, 5:31 AM

## 2020-10-14 NOTE — Anesthesia Preprocedure Evaluation (Addendum)
Anesthesia Evaluation  Patient identified by MRN, date of birth, ID band Patient awake    Reviewed: Allergy & Precautions, NPO status , Patient's Chart, lab work & pertinent test results  Airway Mallampati: II  TM Distance: >3 FB Neck ROM: Full    Dental  (+) Edentulous Upper, Edentulous Lower, Upper Dentures, Lower Dentures   Pulmonary neg pulmonary ROS, former smoker,    Pulmonary exam normal breath sounds clear to auscultation       Cardiovascular negative cardio ROS Normal cardiovascular exam Rhythm:Regular Rate:Normal     Neuro/Psych negative neurological ROS  negative psych ROS   GI/Hepatic negative GI ROS, Neg liver ROS,   Endo/Other  negative endocrine ROS  Renal/GU negative Renal ROS  negative genitourinary   Musculoskeletal negative musculoskeletal ROS (+)   Abdominal   Peds negative pediatric ROS (+)  Hematology negative hematology ROS (+)   Anesthesia Other Findings   Reproductive/Obstetrics negative OB ROS                            Anesthesia Physical Anesthesia Plan  ASA: II  Anesthesia Plan: General   Post-op Pain Management:    Induction: Intravenous  PONV Risk Score and Plan: 2 and Ondansetron, Midazolam and Treatment may vary due to age or medical condition  Airway Management Planned: LMA  Additional Equipment:   Intra-op Plan:   Post-operative Plan: Extubation in OR  Informed Consent: I have reviewed the patients History and Physical, chart, labs and discussed the procedure including the risks, benefits and alternatives for the proposed anesthesia with the patient or authorized representative who has indicated his/her understanding and acceptance.     Dental advisory given  Plan Discussed with: CRNA  Anesthesia Plan Comments:         Anesthesia Quick Evaluation

## 2020-10-16 ENCOUNTER — Encounter (HOSPITAL_BASED_OUTPATIENT_CLINIC_OR_DEPARTMENT_OTHER): Payer: Self-pay | Admitting: Orthopedic Surgery

## 2020-10-23 ENCOUNTER — Telehealth: Payer: Self-pay

## 2020-10-23 NOTE — Telephone Encounter (Signed)
Patient called stating that he had right knee surgery on 10/14/2020 and that he has swelling from his right foot up to his right thigh.  Would like to know if this is normal?   Offered patient an appointment to come into the office today, but he declined due to being in [place].  Appointment is 10/24/2020 with Denny Peon.  CB# [phone].  Please advise.  Thank you.

## 2020-10-24 ENCOUNTER — Encounter: Payer: Self-pay | Admitting: Family

## 2020-10-24 ENCOUNTER — Ambulatory Visit (INDEPENDENT_AMBULATORY_CARE_PROVIDER_SITE_OTHER): Payer: 59 | Admitting: Family

## 2020-10-24 DIAGNOSIS — M23200 Derangement of unspecified lateral meniscus due to old tear or injury, right knee: Secondary | ICD-10-CM

## 2020-10-24 NOTE — Progress Notes (Signed)
   Post-Op Visit Note   Patient: Seth Cox           Date of Birth: January 01, 1951           MRN: [id number] Visit Date: 10/24/2020 PCP: [name], MD  Chief Complaint: No chief complaint on file.   HPI:  HPI The patient is a 70 year old gentleman seen status post total right knee arthroplasty 4 weeks ago.  His sutures are in place.  He complains of some issues with swelling in his lower extremity he notes this is relieved by elevation no warmth no tenderness Ortho Exam On examination of the right knee the surgical portals are well-healed the sutures are in place there is no erythema no drainage he does have 2+ pitting edema of the right lower extremity up to the knee.  There is no warmth no weeping.  No palpable nodules or cords  Visit Diagnoses:  1. Old complex tear of lateral meniscus of right knee     Plan: Sutures harvested today without incident.  He will continue to increase his activities as he tolerates.  Follow-Up Instructions: Return in about 4 weeks (around 11/21/2020), or if symptoms worsen or fail to improve.   Imaging: No results found.  Orders:  No orders of the defined types were placed in this encounter.  No orders of the defined types were placed in this encounter.    PMFS History: Patient Active Problem List   Diagnosis Date Noted   Old complex tear of lateral meniscus of right knee    Chondromalacia of medial femoral condyle, right    Acquired thrombocytopenia (HCC) 10/01/2018   Prediabetes 10/01/2018   Plantar fasciitis 07/15/2018   Chest pain with moderate risk for cardiac etiology 07/11/2017   Sinus bradycardia 07/11/2017   Elevated blood pressure reading without diagnosis of hypertension    History of BPH    Past Medical History:  Diagnosis Date   Elevated heart rate with elevated blood pressure without diagnosis of hypertension    History of BPH     Family History  Problem Relation Age of Onset   Fibromyalgia Mother    Healthy  Sister    Hypertension Brother    Heart failure Sister        Died at age 16.  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

## 2020-11-14 ENCOUNTER — Ambulatory Visit (INDEPENDENT_AMBULATORY_CARE_PROVIDER_SITE_OTHER): Payer: 59 | Admitting: Family

## 2020-11-14 ENCOUNTER — Encounter: Payer: Self-pay | Admitting: Family

## 2020-11-14 DIAGNOSIS — M545 Low back pain, unspecified: Secondary | ICD-10-CM

## 2020-11-14 NOTE — Progress Notes (Signed)
Post-Op Visit Note   Patient: Seth Cox           Date of Birth: [birth year]           MRN: [id number] Visit Date: 11/14/2020 PCP: [name], MD  Chief Complaint: No chief complaint on file.   HPI:  HPI The patient is a 70 year old gentleman seen status post right knee arthroscopy.  He continues to have swelling in his lower extremity, worsens over the course of the day continues to be relieved by elevation. no warmth no tenderness.   He is concerned today about some lateral right knee pain that shoots down the lateral aspect of his lower leg as well as up the lateral aspect of his thigh into his hip and low back.  Does have a history of radiculopathy has had recent ESI with Dr. Alvester Morin.  Last injection was just prior to his arthroscopy states he only gained about a day of relief. Ortho Exam On examination of the right knee the surgical portals are well-healed.  There is 1+ pitting edema of the right lower extremity up to the knee.  There is no warmth no weeping.  No palpable nodules or cords.  Visit Diagnoses:  1. Acute right-sided low back pain, unspecified whether sciatica present     Plan: Discussed typical course of swelling and pain following arthroscopy.  Reassurance provided.  He will continue with advancing his activities as he tolerates.  He will follow-up in the office on an as-needed basis for his right knee.  We will refer him back to Dr. Alvester Morin for repeat [facility]  Follow-Up Instructions: Return if symptoms worsen or fail to improve.   Imaging: No results found.  Orders:  Orders Placed This Encounter  Procedures   Ambulatory referral to Physical Medicine Rehab    No orders of the defined types were placed in this encounter.    PMFS History: Patient Active Problem List   Diagnosis Date Noted   Old complex tear of lateral meniscus of right knee    Chondromalacia of medial femoral condyle, right    Acquired thrombocytopenia (HCC) 10/01/2018    Prediabetes 10/01/2018   Plantar fasciitis 07/15/2018   Chest pain with moderate risk for cardiac etiology 07/11/2017   Sinus bradycardia 07/11/2017   Elevated blood pressure reading without diagnosis of hypertension    History of BPH    Past Medical History:  Diagnosis Date   Elevated heart rate with elevated blood pressure without diagnosis of hypertension    History of BPH     Family History  Problem Relation Age of Onset   Fibromyalgia Mother    Healthy Sister    Hypertension Brother    Heart failure Sister        Died at age 96.  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

## 2020-12-04 ENCOUNTER — Other Ambulatory Visit: Payer: Self-pay

## 2020-12-04 ENCOUNTER — Ambulatory Visit (INDEPENDENT_AMBULATORY_CARE_PROVIDER_SITE_OTHER): Payer: 59 | Admitting: Physical Medicine and Rehabilitation

## 2020-12-04 ENCOUNTER — Encounter: Payer: Self-pay | Admitting: Physical Medicine and Rehabilitation

## 2020-12-04 ENCOUNTER — Ambulatory Visit: Payer: Self-pay

## 2020-12-04 VITALS — BP 152/88 | HR 46

## 2020-12-04 DIAGNOSIS — M5416 Radiculopathy, lumbar region: Secondary | ICD-10-CM

## 2020-12-04 MED ORDER — METHYLPREDNISOLONE ACETATE 80 MG/ML IJ SUSP
80.0000 mg | Freq: Once | INTRAMUSCULAR | Status: AC
  Administered 2020-12-04: 80 mg

## 2020-12-04 NOTE — Patient Instructions (Signed)

## 2020-12-04 NOTE — Progress Notes (Signed)
Seth Cox - 70 y.o. male MRN [id number]  Date of birth: Feb 05, 1951  Office Visit Note: Visit Date: 12/04/2020 PCP: [name], MD Referred by: [name], MD  Subjective: Chief Complaint  Patient presents with   Lower Back - Pain   Right Knee - Pain   HPI:  Seth Cox is a 70 y.o. male who comes in today for planned repeat Right L4-L5  Lumbar Transforaminal epidural steroid injection with fluoroscopic guidance.  The patient has failed conservative care including home exercise, medications, time and activity modification.  This injection will be diagnostic and hopefully therapeutic.  Please see requesting physician notes for further details and justification.  He had excellent relief for short period and then symptoms slowly returned.  In the interim he has had knee surgery.  He still dealing with right knee pain.  Interesting finding is that his symptoms seem to be more of an L5 distribution if it is nerve related but he has a transitional S1 segment so this can make the nerve root dermatomes a little bit off.  He also has some pain consistent with trochanteric bursitis.  Prior injection did offer more than 50% relief as noted above and lasted more than 3 months and did give the patient more functional ability for activities of daily living and was also beneficial in that it did reduce their medication requirement.  They have had physical therapy and continue with directed home exercise program.  Current medication management is not beneficial in increasing their functional status.  Please note that procedures are done as part of a comprehensive orthopedic and pain management program with access to in-house orthopedics, spine surgery and physical therapy as well as access to [facility] biopsychosocial counseling if needed.    Referring: Dr. Aldean Baker   ROS Otherwise per HPI.  Assessment & Plan: Visit Diagnoses:    ICD-10-CM   1. Lumbar  radiculopathy  M54.16 XR C-ARM NO REPORT    Epidural Steroid injection    methylPREDNISolone acetate (DEPO-MEDROL) injection 80 mg      Plan: No additional findings.   Meds & Orders:  Meds ordered this encounter  Medications   methylPREDNISolone acetate (DEPO-MEDROL) injection 80 mg    Orders Placed This Encounter  Procedures   XR C-ARM NO REPORT   Epidural Steroid injection    Follow-up: No follow-ups on file.   Procedures: No procedures performed  Lumbosacral Transforaminal Epidural Steroid Injection - Sub-Pedicular Approach with Fluoroscopic Guidance  Patient: Seth Cox      Date of Birth: May 31, 1950 MRN: [id number] PCP: [name], MD      Visit Date: 12/04/2020   Universal Protocol:    Date/Time: 12/04/2020  Consent Given By: the patient  Position: PRONE  Additional Comments: Vital signs were monitored before and after the procedure. Patient was prepped and draped in the usual sterile fashion. The correct patient, procedure, and site was verified.   Injection Procedure Details:   Procedure diagnoses: Lumbar radiculopathy [M54.16]    Meds Administered:  Meds ordered this encounter  Medications   methylPREDNISolone acetate (DEPO-MEDROL) injection 80 mg    Laterality: Right  Location/Site:  L4-L5  Needle:5.0 in., 22 ga.  Short bevel or Quincke spinal needle  Needle Placement: Transforaminal  Findings:    -Comments: Excellent flow of contrast along the nerve, nerve root and into the epidural space.  Procedure Details: After squaring off the end-plates to get a true AP view, the C-arm  was positioned so that an oblique view of the foramen as noted above was visualized. The target area is just inferior to the "nose of the scotty dog" or sub pedicular. The soft tissues overlying this structure were infiltrated with 2-3 ml. of 1% Lidocaine without Epinephrine.  The spinal needle was inserted toward the target using a "trajectory" view along  the fluoroscope beam.  Under AP and lateral visualization, the needle was advanced so it did not puncture dura and was located close the 6 O'Clock position of the pedical in AP tracterory. Biplanar projections were used to confirm position. Aspiration was confirmed to be negative for CSF and/or blood. A 1-2 ml. volume of Isovue-250 was injected and flow of contrast was noted at each level. Radiographs were obtained for documentation purposes.   After attaining the desired flow of contrast documented above, a 0.5 to 1.0 ml test dose of 0.25% Marcaine was injected into each respective transforaminal space.  The patient was observed for 90 seconds post injection.  After no sensory deficits were reported, and normal lower extremity motor function was noted,   the above injectate was administered so that equal amounts of the injectate were placed at each foramen (level) into the transforaminal epidural space.   Additional Comments:  No complications occurred Dressing: 2 x 2 sterile gauze and Band-Aid    Post-procedure details: Patient was observed during the procedure. Post-procedure instructions were reviewed.  Patient left the clinic in stable condition.    Clinical History: MRI LUMBAR SPINE WITHOUT CONTRAST   TECHNIQUE: Multiplanar, multisequence MR imaging of the lumbar spine was performed. No intravenous contrast was administered.   COMPARISON:  Lumbar radiographs 05/05/2020.   Chest radiographs 1017.   FINDINGS: Segmentation: 12 full size ribs in 2007. Therefore hypoplastic ribs are designated at L1 along with a mostly lumbarized S1 segment. Correlation with radiographs is recommended prior to any operative intervention.   Alignment: Straightening of lumbar lordosis with subtle anterolisthesis at both L4-L5 and L5-S1.   Vertebrae: No marrow edema or evidence of acute osseous abnormality. Visualized bone marrow signal is within normal limits. Intact visible sacrum and SI  joints.   Conus medullaris and cauda equina: Conus extends to the T12-L1 level. No lower spinal cord or conus signal abnormality.   Paraspinal and other soft tissues: Negative.   Disc levels:   T12-L1:  Negative.   L1-L2:  Negative.   L2-L3:  Subtle disc bulging.  No stenosis.   L3-L4: Mild circumferential disc bulge eccentric to the left. Mild facet and moderate ligament flavum hypertrophy. Borderline to mild spinal stenosis, left L3 foraminal stenosis.   L4-L5: Disc desiccation. Circumferential disc bulge. Moderate facet and ligament flavum hypertrophy. Mild spinal and bilateral lateral recess stenosis (L5 nerve levels). Endplate spurring contributes to moderate bilateral L4 foraminal stenosis.   L5-S1: Disc desiccation, circumferential disc bulge and endplate spurring. Small left subarticular annular fissure of the disc (series 3, image 10). Moderate facet hypertrophy. No spinal stenosis. Mild bilateral lateral recess stenosis (S1 nerve levels). Mild bilateral L5 foraminal stenosis.   S1-S2: Mostly lumbarized, negative.   IMPRESSION: 1. 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 Correlation with radiographs is recommended prior to any operative intervention.   2. Subtle anterolisthesis at both L4-L5 and L5-S1 with disc bulging and moderate posterior element hypertrophy. Mild spinal stenosis at the former. Mild bilateral lateral recess stenosis at both levels. Moderate bilateral L4 neural foraminal stenosis.   3. Similar multifactorial borderline  to mild spinal and foraminal stenosis at L3-L4.     Electronically Signed   By: Odessa Fleming M.D.   On: 05/29/2020 07:44     Objective:  VS:  HT:    WT:   BMI:     BP:(!) 152/88  HR:(!) 46bpm  TEMP: ( )  RESP:  Physical Exam Vitals and nursing note reviewed.  Constitutional:      General: He is not in acute distress.    Appearance: Normal appearance. He is not  ill-appearing.  HENT:     Head: Normocephalic and atraumatic.     Right Ear: External ear normal.     Left Ear: External ear normal.     Nose: No congestion.  Eyes:     Extraocular Movements: Extraocular movements intact.  Cardiovascular:     Rate and Rhythm: Normal rate.     Pulses: Normal pulses.  Pulmonary:     Effort: Pulmonary effort is normal. No respiratory distress.  Abdominal:     General: There is no distension.     Palpations: Abdomen is soft.  Musculoskeletal:        General: No tenderness or signs of injury.     Cervical back: Neck supple.     Right lower leg: No edema.     Left lower leg: No edema.     Comments: Patient has good distal strength without clonus.  Skin:    Findings: No erythema or rash.  Neurological:     General: No focal deficit present.     Mental Status: He is alert and oriented to person, place, and time.     Sensory: No sensory deficit.     Motor: No weakness or abnormal muscle tone.     Coordination: Coordination normal.  Psychiatric:        Mood and Affect: Mood normal.        Behavior: Behavior normal.     Imaging: XR C-ARM NO REPORT  Result Date: 12/04/2020 Please see Notes tab for imaging impression.

## 2020-12-04 NOTE — Progress Notes (Signed)
Pt state lower back pain that travels to his right knee. 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 Pt state he takes pain meds to help ease his pain. Pt has hx of inj on 07/17/20 pt state it helped.  Numeric Pain Rating Scale and Functional Assessment Average Pain 5   In the last MONTH (on 0-10 scale) has pain interfered with the following?  1. General activity like being  able to carry out your everyday physical activities such as walking, climbing stairs, carrying groceries, or moving a chair?  Rating(9)   +Driver, -BT, -Dye Allergies.

## 2020-12-05 NOTE — Procedures (Signed)
Lumbosacral Transforaminal Epidural Steroid Injection - Sub-Pedicular Approach with Fluoroscopic Guidance  Patient: Seth Cox      Date of Birth: 04-11-1951 MRN: [id number] PCP: [name], MD      Visit Date: 12/04/2020   Universal Protocol:    Date/Time: 12/04/2020  Consent Given By: the patient  Position: PRONE  Additional Comments: Vital signs were monitored before and after the procedure. Patient was prepped and draped in the usual sterile fashion. The correct patient, procedure, and site was verified.   Injection Procedure Details:   Procedure diagnoses: Lumbar radiculopathy [M54.16]    Meds Administered:  Meds ordered this encounter  Medications   methylPREDNISolone acetate (DEPO-MEDROL) injection 80 mg    Laterality: Right  Location/Site:  L4-L5  Needle:5.0 in., 22 ga.  Short bevel or Quincke spinal needle  Needle Placement: Transforaminal  Findings:    -Comments: Excellent flow of contrast along the nerve, nerve root and into the epidural space.  Procedure Details: After squaring off the end-plates to get a true AP view, the C-arm was positioned so that an oblique view of the foramen as noted above was visualized. The target area is just inferior to the "nose of the scotty dog" or sub pedicular. The soft tissues overlying this structure were infiltrated with 2-3 ml. of 1% Lidocaine without Epinephrine.  The spinal needle was inserted toward the target using a "trajectory" view along the fluoroscope beam.  Under AP and lateral visualization, the needle was advanced so it did not puncture dura and was located close the 6 O'Clock position of the pedical in AP tracterory. Biplanar projections were used to confirm position. Aspiration was confirmed to be negative for CSF and/or blood. A 1-2 ml. volume of Isovue-250 was injected and flow of contrast was noted at each level. Radiographs were obtained for documentation purposes.   After attaining the  desired flow of contrast documented above, a 0.5 to 1.0 ml test dose of 0.25% Marcaine was injected into each respective transforaminal space.  The patient was observed for 90 seconds post injection.  After no sensory deficits were reported, and normal lower extremity motor function was noted,   the above injectate was administered so that equal amounts of the injectate were placed at each foramen (level) into the transforaminal epidural space.   Additional Comments:  No complications occurred Dressing: 2 x 2 sterile gauze and Band-Aid    Post-procedure details: Patient was observed during the procedure. Post-procedure instructions were reviewed.  Patient left the clinic in stable condition.

## 2020-12-10 ENCOUNTER — Telehealth: Payer: Self-pay | Admitting: Physical Medicine and Rehabilitation

## 2020-12-10 NOTE — Telephone Encounter (Signed)
FYI

## 2020-12-10 NOTE — Telephone Encounter (Signed)
Pt called and states that the shot is doing good! He said thank you!   [phone]

## 2021-02-02 ENCOUNTER — Other Ambulatory Visit: Payer: Self-pay | Admitting: Internal Medicine

## 2021-02-02 DIAGNOSIS — Z122 Encounter for screening for malignant neoplasm of respiratory organs: Secondary | ICD-10-CM

## 2021-02-14 ENCOUNTER — Other Ambulatory Visit: Payer: Self-pay

## 2021-02-14 ENCOUNTER — Ambulatory Visit
Admission: RE | Admit: 2021-02-14 | Discharge: 2021-02-14 | Disposition: A | Discharge status: Home / Self Care | Payer: 59 | Source: Ambulatory Visit | Attending: Internal Medicine | Admitting: Internal Medicine

## 2021-02-14 DIAGNOSIS — Z122 Encounter for screening for malignant neoplasm of respiratory organs: Secondary | ICD-10-CM

## 2021-02-27 ENCOUNTER — Telehealth: Payer: Self-pay | Admitting: Physical Medicine and Rehabilitation

## 2021-02-27 ENCOUNTER — Other Ambulatory Visit: Payer: Self-pay

## 2021-02-27 ENCOUNTER — Ambulatory Visit (INDEPENDENT_AMBULATORY_CARE_PROVIDER_SITE_OTHER): Payer: 59 | Admitting: Family

## 2021-02-27 DIAGNOSIS — M5416 Radiculopathy, lumbar region: Secondary | ICD-10-CM

## 2021-02-27 DIAGNOSIS — M25561 Pain in right knee: Secondary | ICD-10-CM

## 2021-02-27 DIAGNOSIS — G8929 Other chronic pain: Secondary | ICD-10-CM | POA: Diagnosis not present

## 2021-02-27 NOTE — Telephone Encounter (Signed)
Pt called requesting a call back to set an appt for knee injection. Pt phone number is [phone].

## 2021-03-04 NOTE — Progress Notes (Signed)
Post-Op Visit Note   Patient: Seth Cox           Date of Birth: November 10, 1950           MRN: [id number] Visit Date: 02/27/2021 PCP: [name], MD  Chief Complaint:  Chief Complaint  Patient presents with   Right Knee - Pain, Follow-up     HPI:  HPI The patient is a 70 year old gentleman who presents today in follow-up.  He did have right knee arthroscopy several months ago.  Most recently he has been seen in our office by Dr. Alvester Morin.  He has right radicular symptoms.  Today he relates he thought his appointment was with Dr. Alvester Morin rather than [place].  He relates that he is satisfied with the knee he continues to have some pain intermittent swelling some mechanical symptoms but he is comfortable with it in his current state.  He is hoping to see Dr. Alvester Morin for his radicular symptoms.  Ortho Exam On examination of the right knee the surgical portals are well-healed.  There is no edema, no warmth no weeping.    Visit Diagnoses:  No diagnosis found.   Plan: Discussed osteoarthritis right knee.  He will follow-up in the office on an as-needed basis for his right knee.  Relates he is going to follow up with dr newton.   Follow-Up Instructions: No follow-ups on file.   Imaging: No results found.  Orders:  No orders of the defined types were placed in this encounter.   No orders of the defined types were placed in this encounter.    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

## 2021-03-09 ENCOUNTER — Telehealth: Payer: Self-pay | Admitting: Physical Medicine and Rehabilitation

## 2021-03-09 NOTE — Telephone Encounter (Signed)
Patient called. He would like some pain medication called in. His call back number is [phone]

## 2021-03-11 MED ORDER — OXYCODONE-ACETAMINOPHEN 5-325 MG PO TABS: 1.0000 | ORAL_TABLET | Freq: Three times a day (TID) | ORAL | 0 refills | Status: AC | PRN

## 2021-03-18 ENCOUNTER — Ambulatory Visit (INDEPENDENT_AMBULATORY_CARE_PROVIDER_SITE_OTHER): Payer: 59 | Admitting: Family

## 2021-03-18 ENCOUNTER — Ambulatory Visit: Payer: 59 | Admitting: Physical Medicine and Rehabilitation

## 2021-03-18 ENCOUNTER — Other Ambulatory Visit: Payer: Self-pay

## 2021-03-18 DIAGNOSIS — M23200 Derangement of unspecified lateral meniscus due to old tear or injury, right knee: Secondary | ICD-10-CM

## 2021-03-18 DIAGNOSIS — M1711 Unilateral primary osteoarthritis, right knee: Secondary | ICD-10-CM | POA: Diagnosis not present

## 2021-03-18 DIAGNOSIS — M94261 Chondromalacia, right knee: Secondary | ICD-10-CM | POA: Diagnosis not present

## 2021-03-20 NOTE — Progress Notes (Signed)
Office Visit Note   Patient: Seth Cox           Date of Birth: 1950/09/24           MRN: [id number] Visit Date: 03/18/2021              Requested by: [name], MD [address] [place],  [place] [address] PCP: [name], MD  Chief Complaint  Patient presents with   Right Knee - Follow-up    Steroid injection      HPI: The patient is a 70 year old gentleman who is seen for osteoarthritis and related knee pain on the right.  He is status post right knee arthroscopy earlier this year with meniscal injury.  He also has some right-sided lumbar radiculopathy that he has been working with Dr. Alvester Morin on over the last several months.  Today presents for knee pain evaluation.  Having worse pain with ambulation squatting bending. denies mechanical symptoms.  Denies swelling.  Assessment & Plan: Visit Diagnoses:  1. Old complex tear of lateral meniscus of right knee   2. Chondromalacia of medial femoral condyle, right   3. Osteoarthritis of right knee, unspecified osteoarthritis type     Plan: Tolerated Depo-Medrol injection well.  He will follow-up as needed.  Follow-Up Instructions: Return if symptoms worsen or fail to improve.   Right Knee Exam   Tenderness  The patient is experiencing tenderness in the medial joint line.  Range of Motion  The patient has normal right knee ROM.  Tests  Varus: negative Valgus: negative  Other  Effusion: no effusion present     Patient is alert, oriented, no adenopathy, well-dressed, normal affect, normal respiratory effort.   Imaging: No results found. No images are attached to the encounter.  Labs: No results found for: HGBA1C, ESRSEDRATE, CRP, LABURIC, REPTSTATUS, GRAMSTAIN, CULT, LABORGA   No results found for: ALBUMIN, PREALBUMIN, CBC  No results found for: MG No results found for: VD25OH  No results found for: PREALBUMIN CBC EXTENDED Latest Ref Rng & Units 01/09/2010  HGB 13.0 -  17.0 g/dL 27.2  HCT 53.6 - 64.4 % 50.0     There is no height or weight on file to calculate BMI.  Orders:  Orders Placed This Encounter  Procedures   Large Joint Inj: R knee   No orders of the defined types were placed in this encounter.    Procedures: Large Joint Inj: R knee on 04/29/2021 9:14 AM Indications: pain Details: 18 G 1.5 in needle, anteromedial approach Medications: 5 mL lidocaine 1 %; 40 mg methylPREDNISolone acetate 40 MG/ML Consent was given by the patient.     Clinical Data: No additional findings.  ROS:  All other systems negative, except as noted in the HPI. Review of Systems  Objective: Vital Signs: There were no vitals taken for this visit.  Specialty Comments:  No specialty comments available.  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

## 2021-04-29 ENCOUNTER — Encounter: Payer: Self-pay | Admitting: Family

## 2021-04-29 DIAGNOSIS — M1711 Unilateral primary osteoarthritis, right knee: Secondary | ICD-10-CM | POA: Diagnosis not present

## 2021-04-29 MED ORDER — LIDOCAINE HCL 1 % IJ SOLN
5.0000 mL | INTRAMUSCULAR | Status: AC | PRN
  Administered 2021-04-29: 09:00:00 5 mL

## 2021-04-29 MED ORDER — METHYLPREDNISOLONE ACETATE 40 MG/ML IJ SUSP
40.0000 mg | INTRAMUSCULAR | Status: AC | PRN
Start: 2021-04-29 — End: 2021-04-29
  Administered 2021-04-29: 09:00:00 40 mg via INTRA_ARTICULAR

## 2021-09-22 ENCOUNTER — Ambulatory Visit (INDEPENDENT_AMBULATORY_CARE_PROVIDER_SITE_OTHER): Payer: Commercial Managed Care - PPO | Admitting: Orthopedic Surgery

## 2021-09-22 DIAGNOSIS — M1711 Unilateral primary osteoarthritis, right knee: Secondary | ICD-10-CM

## 2021-10-16 ENCOUNTER — Ambulatory Visit: Payer: Commercial Managed Care - PPO | Attending: Internal Medicine

## 2021-10-16 DIAGNOSIS — R2681 Unsteadiness on feet: Secondary | ICD-10-CM | POA: Insufficient documentation

## 2021-10-16 DIAGNOSIS — R42 Dizziness and giddiness: Secondary | ICD-10-CM | POA: Insufficient documentation

## 2021-10-16 NOTE — Therapy (Signed)
OUTPATIENT PHYSICAL THERAPY VESTIBULAR EVALUATION     Patient Name: Seth Cox MRN: [id number] DOB:04/21/1951, 71 y.o., male Today's Date: 10/16/2021  PCP: [name], MD REFERRING PROVIDER: [name], MD   PT End of Session - 10/16/21 1300     Visit Number 1    Number of Visits 7    Date for PT Re-Evaluation 11/27/21    Authorization Type [employer]    Authorization Time Period awaiting authorization    Progress Note Due on Visit 10    PT Start Time 1315    PT Stop Time 1358    PT Time Calculation (min) 43 min    Activity Tolerance Patient tolerated treatment well    Behavior During Therapy WFL for tasks assessed/performed             Past Medical History:  Diagnosis Date   Elevated heart rate with elevated blood pressure without diagnosis of hypertension    History of BPH    Past Surgical History:  Procedure Laterality Date   CHONDROPLASTY Right 10/14/2020   Procedure: CHONDROPLASTY;  Surgeon: Nadara Mustard, MD;  Location: [facility];  Service: Orthopedics;  Laterality: Right;   KNEE ARTHROSCOPY WITH LATERAL MENISECTOMY Right 10/14/2020   Procedure: RIGHT KNEE ARTHROSCOPY WITH PARTALATERAL MENISECTOMY;  Surgeon: Nadara Mustard, MD;  Location:  SURGERY CENTER;  Service: Orthopedics;  Laterality: Right;   MANDIBLE FRACTURE SURGERY     Patient Active Problem List   Diagnosis Date Noted   Old complex tear of lateral meniscus of right knee    Chondromalacia of medial femoral condyle, right    Acquired thrombocytopenia (HCC) 10/01/2018   Prediabetes 10/01/2018   Plantar fasciitis 07/15/2018   Chest pain with moderate risk for cardiac etiology 07/11/2017   Sinus bradycardia 07/11/2017   Elevated blood pressure reading without diagnosis of hypertension    History of BPH     ONSET DATE: 10/13/21  REFERRING DIAG: R42 (ICD-10-CM) - Vertigo  THERAPY DIAG:  Dizziness and giddiness  Unsteadiness on  feet  Rationale for Evaluation and Treatment Rehabilitation  SUBJECTIVE:   SUBJECTIVE STATEMENT: Patient reports he noticed the dizziness when he is moving around real fast or quick movements of the head/body. Started to notice it a few months ago. Denies falls. Reports he has had a spinning sensation, as well as some lightheadedness. Denies dizziness with bed mobility, typically feels better when he sits down or lays down. Patient reports it is brief typically a few minutes, feels like it improves once he sits down. Denies changes in hearing or vision. Does have some intermittent nausea, but no vomiting. Reports it was a gradual onset.  Pt accompanied by: self  PERTINENT HISTORY: HTN, Lumbar Radiculopathy, CKD Stage 2, HLD, Bradycardia, OA, GERD   PAIN:  Are you having pain? No  PRECAUTIONS: None  WEIGHT BEARING RESTRICTIONS No  FALLS: Has patient fallen in last 6 months? 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 [employer] of Month  PATIENT GOALS : Improve the Dizziness  OBJECTIVE:   COGNITION: Overall cognitive status: Within functional limits for tasks assessed   POSTURE: No Significant postural limitations   STRENGTH: WFL  TRANSFERS: Assistive device utilized: None  Sit to stand: Complete Independence Stand to sit: Complete Independence  GAIT: Gait pattern: [facility] Distance  walked: clinic distance Assistive device utilized: None Level of assistance: SBA Comments: mild sway noted intermittent; most notable after DVA testing  PATIENT SURVEYS:  FOTO DPS: 55.9, DFS: 48   VESTIBULAR ASSESSMENT   SYMPTOM BEHAVIOR:   Subjective history: see subjective   Non-Vestibular symptoms: headaches; reports some headaches on the R side of the head. Probably  experience 1-2x/week   Type of dizziness: Imbalance (Disequilibrium), Spinning/Vertigo, Unsteady with head/body turns, and Lightheadedness/Faint   Frequency: daily   Duration: minutes   Aggravating factors: Induced by motion: looking up at the ceiling, bending down to the ground, turning body quickly, and turning head quickly and Worse in the morning   Relieving factors: head stationary and rest   Progression of symptoms: unchanged   OCULOMOTOR EXAM:   Ocular Alignment: normal   Ocular ROM: No Limitations   Spontaneous Nystagmus: absent   Gaze-Induced Nystagmus: absent   Smooth Pursuits: intact; reports increased dizziness, moderate with horizontal > vertical.    Saccades: slow; moderate dizziness with saccades from midline to L side      VESTIBULAR - OCULAR REFLEX:    Slow VOR: Comment: difficulty maintaining eyes focused   VOR Cancellation: Comment: mild dizziness   Head-Impulse Test: HIT Right: negative HIT Left: positive   Dynamic Visual Acuity: Static: 11 Dynamic: 9; moderate dizziness after completion    POSITIONAL TESTING: Right Dix-Hallpike: none; Duration:0 Left Dix-Hallpike: none; Duration: 0 Right Roll Test: none; Duration: 0 Left Roll Test: none; Duration: 0    MOTION SENSITIVITY:    Motion Sensitivity Quotient  Intensity: 0 = none, 1 = Lightheaded, 2 = Mild, 3 = Moderate, 4 = Severe, 5 = Vomiting  Intensity  1. Sitting to supine 0  2. Supine to L side 0  3. Supine to R side 0  4. Supine to sitting 1  5. L Hallpike-Dix 0  6. Up from L  1  7. R Hallpike-Dix 0  8. Up from R  1  9. Sitting, head  tipped to L knee 0  10. Head up from L  knee 1  11. Sitting, head  tipped to R knee 0  12. Head up from R  knee 1  13. Sitting head turns x5 2  14.Sitting head nods x5 2  15. In stance, 180  turn to L  0  16. In stance, 180  turn to R 0     FUNCTIONAL GAIT: MCTSIB: Condition 1: Avg of 3 trials: 30 sec, Condition 2: Avg of 3 trials: 30 sec, Condition 3:  Avg of 3 trials: 30 sec, and Condition 4: Avg of 3 trials: 30 sec; mild dizziness and increased postural sway noted   PATIENT EDUCATION: Education details: Educated on [employer] Person educated: Patient Education method: Explanation Education comprehension: verbalized understanding   GOALS: Goals reviewed with patient? Yes  SHORT TERM GOALS = LONG TERM GOALS  LONG TERM GOALS: Target date: 11/13/2021    Pt will be independent with final and progressive HEP for improved balance and vestibular input  Baseline: No HEP established Goal status: INITIAL  2.  Pt will improve FGA by 4 points from baseline to demonstrate improved balance and reduced fall risk Baseline: TBA Goal status: INITIAL  3.  Pt will report </= 1/5 for all movements on MSQ to indicate improvement in motion sensitivity and improved activity tolerance.  Baseline: 2/5 Goal status: INITIAL  4.  Pt will be able to tolerate >/= 60 seconds of VOR with </= 3/10 dizziness  Baseline: moderate dizziness Goal status: INITIAL  5.  Pt will improve DFS to >/= 59% Baseline: 48%   Goal status: INITIAL  ASSESSMENT:  CLINICAL IMPRESSION: Patient is a 71 y.o. male referred to Neuro OPPT services for Vertigo. Patient's PMH significant for the following HTN, Lumbar Radiculopathy, CKD Stage 2, HLD, Bradycardia, OA, GERD. Upon evaluation, patient presents with the following impairments: impaired balance due to decreased vestibular input, abnormal oculomotor exam, increased motion sensitivity, dizziness, positive HIT demonstrating potential vestibular hypofunction. Negative positional testings noted today during evaluation. Patient will benefit from skilled PT services to address impairments.   OBJECTIVE IMPAIRMENTS decreased activity tolerance, decreased balance, and dizziness.   ACTIVITY LIMITATIONS locomotion level  PARTICIPATION LIMITATIONS: occupation and yard work  PERSONAL FACTORS Age and 1-2 comorbidities: HTN,  Lumbar Radiculopathy, CKD Stage 2, HLD, Bradycardia, OA, GERD  are also affecting patient's functional outcome.   REHAB POTENTIAL: Excellent  CLINICAL DECISION MAKING: Stable/uncomplicated  EVALUATION COMPLEXITY: Low   PLAN: PT FREQUENCY: 1x/week  PT DURATION: 6 weeks (may only need 4 weeks)  PLANNED INTERVENTIONS: Therapeutic exercises, Therapeutic activity, Neuromuscular re-education, Balance training, Gait training, Patient/Family education, Joint manipulation, Joint mobilization, Stair training, Vestibular training, Canalith repositioning, Cryotherapy, and Moist heat  PLAN FOR NEXT SESSION: Assess FGA. Establish Initial HEP focused On Smooth Pursuits, VOR and Balance.   Jones Bales, PT, DPT 10/16/2021, 3:11 PM

## 2021-10-23 ENCOUNTER — Ambulatory Visit: Payer: Commercial Managed Care - PPO

## 2021-10-23 DIAGNOSIS — R42 Dizziness and giddiness: Secondary | ICD-10-CM

## 2021-10-23 DIAGNOSIS — R2681 Unsteadiness on feet: Secondary | ICD-10-CM
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
## 2021-10-25 ENCOUNTER — Encounter: Payer: Self-pay | Admitting: Orthopedic Surgery

## 2021-10-25 DIAGNOSIS — M1711 Unilateral primary osteoarthritis, right knee: Secondary | ICD-10-CM

## 2021-10-25 MED ORDER — METHYLPREDNISOLONE ACETATE 40 MG/ML IJ SUSP
40.0000 mg | INTRAMUSCULAR | Status: AC | PRN
  Administered 2021-10-25: 40 mg via INTRA_ARTICULAR

## 2021-10-25 MED ORDER — LIDOCAINE HCL (PF) 1 % IJ SOLN
5.0000 mL | INTRAMUSCULAR | Status: AC | PRN
  Administered 2021-10-25: 5 mL

## 2021-10-25 NOTE — Progress Notes (Signed)
Office Visit Note   Patient: Seth Cox           Date of Birth: 12/23/1950           MRN: [id number] Visit Date: 09/22/2021              Requested by: [name], MD [address] [place],  [place] [address] PCP: [name], MD  Chief Complaint  Patient presents with   Right Knee - Pain      HPI: Patient is a 71 year old gentleman with osteoarthritis of the right knee patient has had a steroid injection in the past and this provided about 6 months of relief.  Assessment & Plan: Visit Diagnoses:  1. Osteoarthritis of right knee, unspecified osteoarthritis type     Plan: Patient received a steroid injection right knee follow-up as needed  Follow-Up Instructions: Return if symptoms worsen or fail to improve.   Ortho Exam  Patient is alert, oriented, no adenopathy, well-dressed, normal affect, normal respiratory effort. Examination the right knee there is no redness no cellulitis no swelling he is tender to palpation of the medial lateral joint line collaterals and cruciates are stable.  Imaging: No results found. No images are attached to the encounter.  Labs: No results found for: "HGBA1C", "ESRSEDRATE", "CRP", "LABURIC", "REPTSTATUS", "GRAMSTAIN", "CULT", "LABORGA"   No results found for: "ALBUMIN", "PREALBUMIN", "CBC"  No results found for: "MG" No results found for: "VD25OH"  No results found for: "PREALBUMIN"    Latest Ref Rng & Units 01/09/2010   10:07 PM  CBC EXTENDED  Hemoglobin 13.0 - 17.0 g/dL 49.6   HCT 75.9 - 16.3 % 50.0      There is no height or weight on file to calculate BMI.  Orders:  No orders of the defined types were placed in this encounter.  No orders of the defined types were placed in this encounter.    Procedures: Large Joint Inj: R knee on 10/25/2021 2:39 PM Indications: pain and diagnostic evaluation Details: 22 G 1.5 in needle, anteromedial approach  Arthrogram: No  Medications: 5 mL  lidocaine (PF) 1 %; 40 mg methylPREDNISolone acetate 40 MG/ML Outcome: tolerated well, no immediate complications Procedure, treatment alternatives, risks and benefits explained, specific risks discussed. Consent was given by the patient. Immediately prior to procedure a time out was called to verify the correct patient, procedure, equipment, support staff and site/side marked as required. Patient was prepped and draped in the usual sterile fashion.      Clinical Data: No additional findings.  ROS:  All other systems negative, except as noted in the HPI. Review of Systems  Objective: Vital Signs: There were no vitals taken for this visit.  Specialty Comments:  No specialty comments available.  PMFS History: Patient Active Problem List   Diagnosis Date Noted   Old complex tear of lateral meniscus of right knee    Chondromalacia of medial femoral condyle, right    Acquired thrombocytopenia (HCC) 10/01/2018   Prediabetes 10/01/2018   Plantar fasciitis 07/15/2018   Chest pain with moderate risk for cardiac etiology 07/11/2017   Sinus bradycardia 07/11/2017   Elevated blood pressure reading without diagnosis of hypertension    History of BPH    Past Medical History:  Diagnosis Date   Elevated heart rate with elevated blood pressure without diagnosis of hypertension    History of BPH     Family History  Problem Relation Age of Onset   Fibromyalgia Mother  Healthy Sister    Hypertension Brother    Heart failure Sister        Died at age 35.  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

## 2021-10-30 ENCOUNTER — Ambulatory Visit: Payer: Commercial Managed Care - PPO

## 2021-10-30 DIAGNOSIS — R42 Dizziness and giddiness: Secondary | ICD-10-CM | POA: Diagnosis not present

## 2021-10-30 DIAGNOSIS — R2681 Unsteadiness on feet: Secondary | ICD-10-CM

## 2021-10-30 NOTE — Therapy (Signed)
OUTPATIENT PHYSICAL THERAPY VESTIBULAR TREATMENT NOTE   Patient Name: Seth Cox MRN: [id number] DOB:Jun 10, 1950, 71 y.o., male Today's Date: 10/30/2021  PCP: Jamison Oka, MD REFERRING PROVIDER: Jamison Oka, MD   PT End of Session - 10/30/21 1447     Visit Number 3    Number of Visits 7    Date for PT Re-Evaluation 11/27/21    Authorization Type [employer]    Authorization Time Period awaiting authorization    Progress Note Due on Visit 10    PT Start Time 1447    PT Stop Time 1527    PT Time Calculation (min) 40 min    Equipment Utilized During Treatment Gait belt    Activity Tolerance Patient tolerated treatment well    Behavior During Therapy WFL for tasks assessed/performed             Past Medical History:  Diagnosis Date   Elevated heart rate with elevated blood pressure without diagnosis of hypertension    History of BPH    Past Surgical History:  Procedure Laterality Date   CHONDROPLASTY Right 10/14/2020   Procedure: CHONDROPLASTY;  Surgeon: Nadara Mustard, MD;  Location: [facility];  Service: Orthopedics;  Laterality: Right;   KNEE ARTHROSCOPY WITH LATERAL MENISECTOMY Right 10/14/2020   Procedure: RIGHT KNEE ARTHROSCOPY WITH PARTALATERAL MENISECTOMY;  Surgeon: Nadara Mustard, MD;  Location: [facility];  Service: Orthopedics;  Laterality: Right;   MANDIBLE FRACTURE SURGERY     Patient Active Problem List   Diagnosis Date Noted   Old complex tear of lateral meniscus of right knee    Chondromalacia of medial femoral condyle, right    Acquired thrombocytopenia (HCC) 10/01/2018   Prediabetes 10/01/2018   Plantar fasciitis 07/15/2018   Chest pain with moderate risk for cardiac etiology 07/11/2017   Sinus bradycardia 07/11/2017   Elevated blood pressure reading without diagnosis of hypertension    History of BPH     ONSET DATE: 10/13/21  REFERRING DIAG: R42 (ICD-10-CM) - Vertigo  THERAPY DIAG:  Dizziness and  giddiness  Unsteadiness on feet  Rationale for Evaluation and Treatment Rehabilitation  PERTINENT HISTORY: HTN, Lumbar Radiculopathy, CKD Stage 2, HLD, Bradycardia, OA, GERD  PRECAUTIONS: none  SUBJECTIVE: Patient reports doing well- denies dizziness at the moment, but reports "being in a hurry" will quickly exacerbate symptoms. Denies falls.   PAIN:  Are you having pain? No  TODAYS TREATMENT  NMR:    -Gait on treadmill horizontal headturns to name cards (up to 1. at 2% incline)    -ambulating over ground alternating color/number based on what's called out with horizontal headturns     -increase in lateral instability    -standing on rocker board calling out cards   -standing on rocker board tapping toes (anterior/lateral)   -VOR x1 plain background horizontal/vertical x2   -VOR x1 busy background horizontal/vertical x2 (could only tolerate ~20s)    -VOR x1 plain background, standing on wedge, horizontal/vertical x2 (required MinA to maintain balance d/t posterior postural sway HEP: Access Code: [email or web address] URL: [email or web address] Date: 10/23/2021 Prepared by: Merry Lofty  Exercises - Seated Gaze Stabilization with Head Rotation and Horizontal Arm Movement  - 1 x daily - 7 x weekly - 3 sets - 10 reps - Seated Gaze Stabilization with Head Nod  - 1 x daily - 7 x weekly - 3 sets - 10 reps - Imaginary Target with Head Rotation  - 1 x daily - 7 x weekly - 3  sets - 10 reps  PATIENT EDUCATION: Education details: OM results, PT POC Person educated: Patient Education method: Explanation Education comprehension: verbalized understanding     GOALS: Goals reviewed with patient? Yes   SHORT TERM GOALS = LONG TERM GOALS   LONG TERM GOALS: Target date: 11/13/2021     Pt will be independent with final and progressive HEP for improved balance and vestibular input  Baseline: No HEP established Goal status: INITIAL   2.  Pt will improve FGA by 4 points from  baseline to demonstrate improved balance and reduced fall risk Baseline: TBA Goal status: INITIAL   3.  Pt will report </= 1/5 for all movements on MSQ to indicate improvement in motion sensitivity and improved activity tolerance.  Baseline: 2/5 Goal status: INITIAL   4.  Pt will be able to tolerate >/= 60 seconds of VOR with </= 3/10 dizziness  Baseline: moderate dizziness Goal status: INITIAL   5.  Pt will improve DFS to >/= 59% Baseline: 48%            Goal status: INITIAL   ASSESSMENT:   CLINICAL IMPRESSION: Patient seen for skilled PT session with emphasis on vestibular and balance retraining. Patient reporting only few instances of increased dizziness between last session and today- all of which he noted when he was "in a hurry." Patient unable to clarify further. Tolerating higher level balance and ambulatory balance with horizontal head turns. Improved ankle strength and strategy to maintain balance on rocker board. Patient with minimal tolerance to busy background with VOR re-training. Patient tolerating therapy well, continue POC.     OBJECTIVE IMPAIRMENTS decreased activity tolerance, decreased balance, and dizziness.    ACTIVITY LIMITATIONS locomotion level   PARTICIPATION LIMITATIONS: occupation and yard work   PERSONAL FACTORS Age and 1-2 comorbidities: HTN, Lumbar Radiculopathy, CKD Stage 2, HLD, Bradycardia, OA, GERD  are also affecting patient's functional outcome.    REHAB POTENTIAL: Excellent   CLINICAL DECISION MAKING: Stable/uncomplicated   EVALUATION COMPLEXITY: Low     PLAN: PT FREQUENCY: 1x/week   PT DURATION: 6 weeks (may only need 4 weeks)   PLANNED INTERVENTIONS: Therapeutic exercises, Therapeutic activity, Neuromuscular re-education, Balance training, Gait training, Patient/Family education, Joint manipulation, Joint mobilization, Stair training, Vestibular training, Canalith repositioning, Cryotherapy, and Moist heat   PLAN FOR NEXT SESSION:  Progress HEP PRN, ambulatory balance, VOR/smooth pursuit, busy background, outside gait      Westley Foots, PT Westley Foots, PT, DPT, CBIS  10/30/2021, 3:31 PM

## 2021-11-06 ENCOUNTER — Ambulatory Visit: Payer: Commercial Managed Care - PPO

## 2021-11-13 ENCOUNTER — Ambulatory Visit: Payer: Commercial Managed Care - PPO

## 2021-11-13 DIAGNOSIS — R42 Dizziness and giddiness: Secondary | ICD-10-CM

## 2021-11-13 DIAGNOSIS — R2681 Unsteadiness on feet: Secondary | ICD-10-CM

## 2021-11-13 NOTE — Therapy (Signed)
OUTPATIENT PHYSICAL THERAPY VESTIBULAR TREATMENT NOTE   Patient Name: Seth Cox MRN: [id number] DOB:Oct 15, 1950, 71 y.o., male Today's Date: 11/13/2021  PCP: Jamison Oka, MD REFERRING PROVIDER: Jamison Oka, MD   PT End of Session - 11/13/21 1433     Visit Number 4    Number of Visits 7    Date for PT Re-Evaluation 11/27/21    Authorization Type [employer]    Authorization Time Period Auth 7 PT visits 10/16/21 - 11/27/2021    Progress Note Due on Visit 10    PT Start Time 1434    PT Stop Time 1515    PT Time Calculation (min) 41 min    Equipment Utilized During Treatment Gait belt    Activity Tolerance Patient tolerated treatment well    Behavior During Therapy WFL for tasks assessed/performed              Past Medical History:  Diagnosis Date   Elevated heart rate with elevated blood pressure without diagnosis of hypertension    History of BPH    Past Surgical History:  Procedure Laterality Date   CHONDROPLASTY Right 10/14/2020   Procedure: CHONDROPLASTY;  Surgeon: Nadara Mustard, MD;  Location: [facility];  Service: Orthopedics;  Laterality: Right;   KNEE ARTHROSCOPY WITH LATERAL MENISECTOMY Right 10/14/2020   Procedure: RIGHT KNEE ARTHROSCOPY WITH PARTALATERAL MENISECTOMY;  Surgeon: Nadara Mustard, MD;  Location: [facility];  Service: Orthopedics;  Laterality: Right;   MANDIBLE FRACTURE SURGERY     Patient Active Problem List   Diagnosis Date Noted   Old complex tear of lateral meniscus of right knee    Chondromalacia of medial femoral condyle, right    Acquired thrombocytopenia (HCC) 10/01/2018   Prediabetes 10/01/2018   Plantar fasciitis 07/15/2018   Chest pain with moderate risk for cardiac etiology 07/11/2017   Sinus bradycardia 07/11/2017   Elevated blood pressure reading without diagnosis of hypertension    History of BPH     ONSET DATE: 10/13/21  REFERRING DIAG: R42 (ICD-10-CM) - Vertigo  THERAPY  DIAG:  Dizziness and giddiness  Unsteadiness on feet  Rationale for Evaluation and Treatment Rehabilitation  PERTINENT HISTORY: HTN, Lumbar Radiculopathy, CKD Stage 2, HLD, Bradycardia, OA, GERD  PRECAUTIONS: none  SUBJECTIVE: Patient reports no changes in the dizziness,. Only still noticing it when he gets to rushing around. Once he slows down or sits down for a while. Reports subsides in minutes. No other new changes/complaints. No falls.   PAIN:  Are you having pain? No  TODAYS TREATMENT    Gaze Stabilization:  VOR x 1 Horizontal: cues for proper technique, smooth reciprocal movement. x 30 seconds, x 2 reps, mild dizziness  VOR x 1 Vertical: cues for proper technique, smooth reciprocal movement. x 30 seconds, x 2 reps, mild dizziness  Patient have pauses at midline with VOR, cues required for proper completion. Re-educated on proper completion with HEP.   Horizontal VOR with Ambulation: Mild Dizziness, 10' x 3 reps. Cues for technique/sequencing.  Vertical VOR with Ambulation: Mild Dizziness, 10' x 3 reps. Cues for technique/sequencing.   Standing Balance: Surface: Airex Position: Feet Hip Width Apart Completed with: Eyes Closed; Head Turns x 15 Reps and Head Nods x 15 Reps; then Eyes Closed 3 x 30 seconds. Increased postural sway noted   Completed ambulation with eyes closed in hallway, 4 x 20', mild lateral sway with intermittent touch to wall as needed.   Ambulation with Horizontal Head Turns to  Target: Completed 4 x 20', completed ambulation with PT holding target on the R and L side. Very mild increase in dizziness reported.    Updated HEP to include:  Access Code: [email or web address] URL: [email or web address] Date: 11/13/2021 Prepared by: Jethro Bastos  Exercises - Seated Gaze Stabilization with Head Rotation and Horizontal Arm Movement  - 1 x daily - 7 x weekly - 3 sets - 10 reps - Seated Gaze Stabilization with Head Nod  - 1 x daily - 7 x weekly - 3 sets -  10 reps - Imaginary Target with Head Rotation  - 1 x daily - 7 x weekly - 3 sets - 10 reps - Walking with Eyes Closed and Counter Support  - 1 x daily - 7 x weekly - 1 sets - 4-5 reps  Visual Tracking  With ambulation completed visual tracking with self ball toss 4 x 20', cues to keep eyes focused on ball. Then completed diagonal x 15 reps to Bilat directions. Then completed CW/CCW circles x 10 reps each. Mild dizziness reported. Improvements with standing rest break.    PATIENT EDUCATION: Education details: Continue HEP Person educated: Patient Education method: Explanation Education comprehension: verbalized understanding  HOME EXERCISE PROGRAM:  Access Code: [email or web address]   GOALS: Goals reviewed with patient? Yes   SHORT TERM GOALS = LONG TERM GOALS   LONG TERM GOALS: Target date: 11/27/2021     Pt will be independent with final and progressive HEP for improved balance and vestibular input  Baseline: No HEP established Goal status: INITIAL   2.  Pt will improve FGA by 4 points from baseline to demonstrate improved balance and reduced fall risk Baseline: TBA Goal status: INITIAL   3.  Pt will report </= 1/5 for all movements on MSQ to indicate improvement in motion sensitivity and improved activity tolerance.  Baseline: 2/5 Goal status: INITIAL   4.  Pt will be able to tolerate >/= 60 seconds of VOR with </= 3/10 dizziness  Baseline: moderate dizziness Goal status: INITIAL   5.  Pt will improve DFS to >/= 59% Baseline: 48%            Goal status: INITIAL   ASSESSMENT:   CLINICAL IMPRESSION: Today's skilled PT session focused on continued VOR progression with cues required. Continued high level balance, only sway noted with vision removed. Added to HEP. Will continue to progress toward all LTGs.     OBJECTIVE IMPAIRMENTS decreased activity tolerance, decreased balance, and dizziness.    ACTIVITY LIMITATIONS locomotion level   PARTICIPATION LIMITATIONS: occupation and  yard work   PERSONAL FACTORS Age and 1-2 comorbidities: HTN, Lumbar Radiculopathy, CKD Stage 2, HLD, Bradycardia, OA, GERD  are also affecting patient's functional outcome.    REHAB POTENTIAL: Excellent   CLINICAL DECISION MAKING: Stable/uncomplicated   EVALUATION COMPLEXITY: Low     PLAN: PT FREQUENCY: 1x/week   PT DURATION: 6 weeks (may only need 4 weeks)   PLANNED INTERVENTIONS: Therapeutic exercises, Therapeutic activity, Neuromuscular re-education, Balance training, Gait training, Patient/Family education, Joint manipulation, Joint mobilization, Stair training, Vestibular training, Canalith repositioning, Cryotherapy, and Moist heat   PLAN FOR NEXT SESSION: Planned d/c next visit, patient wants to feel comfortable with final HEP and self manage symptoms. , ambulatory balance, VOR/smooth pursuit, busy background, outside gait      Tempie Donning, PT, DPT 11/13/2021, 3:27 PM

## 2021-11-20 ENCOUNTER — Ambulatory Visit: Payer: Commercial Managed Care - PPO | Attending: Internal Medicine

## 2021-11-20 DIAGNOSIS — R2681 Unsteadiness on feet: Secondary | ICD-10-CM | POA: Diagnosis present

## 2021-11-20 DIAGNOSIS — R42 Dizziness and giddiness: Secondary | ICD-10-CM | POA: Insufficient documentation

## 2021-11-20 NOTE — Therapy (Signed)
OUTPATIENT PHYSICAL THERAPY VESTIBULAR TREATMENT NOTE/ DISCHARGE SUMMARY   Patient Name: Seth Cox MRN: [id number] DOB:[birth year], 71 y.o., male Today's Date: 11/20/2021  PCP: Latanya Presser, MD REFERRING PROVIDER: Latanya Presser, MD   PT End of Session - 11/20/21 1316     Visit Number 5    Number of Visits 7    Date for PT Re-Evaluation 11/27/21    Authorization Type [employer]    Authorization Time Period Auth 7 PT visits 10/16/21 - 11/27/2021    Progress Note Due on Visit 10    PT Start Time 1316    PT Stop Time 1345    PT Time Calculation (min) 29 min    Activity Tolerance Patient tolerated treatment well    Behavior During Therapy [facility] for tasks assessed/performed              Past Medical History:  Diagnosis Date   Elevated heart rate with elevated blood pressure without diagnosis of hypertension    History of BPH    Past Surgical History:  Procedure Laterality Date   CHONDROPLASTY Right 10/14/2020   Procedure: CHONDROPLASTY;  Surgeon: Newt Minion, MD;  Location: [place];  Service: Orthopedics;  Laterality: Right;   KNEE ARTHROSCOPY WITH LATERAL MENISECTOMY Right 10/14/2020   Procedure: RIGHT KNEE ARTHROSCOPY WITH PARTALATERAL MENISECTOMY;  Surgeon: Newt Minion, MD;  Location: [place];  Service: Orthopedics;  Laterality: Right;   MANDIBLE FRACTURE SURGERY     Patient Active Problem List   Diagnosis Date Noted   Old complex tear of lateral meniscus of right knee    Chondromalacia of medial femoral condyle, right    Acquired thrombocytopenia ([place]) 10/01/2018   Prediabetes 10/01/2018   Plantar fasciitis 07/15/2018   Chest pain with moderate risk for cardiac etiology 07/11/2017   Sinus bradycardia 07/11/2017   Elevated blood pressure reading without diagnosis of hypertension    History of BPH    PHYSICAL THERAPY DISCHARGE SUMMARY  Visits from Start of Care: 5  Current functional level related to goals /  functional outcomes: Patient remains modified independent and able to self-manage his symptoms of dizziness limiting the speed of his turns and actions as needed.    Remaining deficits: Intermittent dizziness related to speed of movements   Education / Equipment: HEP, PT POC, education on vertigo and how to manage symptoms    Patient agrees to discharge. Patient goals were partially met. Patient is being discharged due to meeting the stated rehab goals.  ONSET DATE: 10/13/21  REFERRING DIAG: R42 (ICD-10-CM) - Vertigo  THERAPY DIAG:  Dizziness and giddiness  Unsteadiness on feet  Rationale for Evaluation and Treatment Rehabilitation  PERTINENT HISTORY: HTN, Lumbar Radiculopathy, CKD Stage 2, HLD, Bradycardia, OA, GERD  PRECAUTIONS: none  SUBJECTIVE: Patient reports doing well, but no changes to dizziness. Reports that he needs to remind himself to slow down at times to minimize experience of dizziness. Agreeable to dc today.   PAIN:  Are you having pain? No  TODAYS TREATMENT  Goal assessment:  -FOTO: 48.8% -FGA: 25/30 -VOR: 60s 3/10 -MSQ: 2/5    [facility] PT Assessment - 11/20/21 0001       Functional Gait  Assessment   Gait assessed  Yes    Gait Level Surface Walks 20 ft in less than 5.5 sec, no assistive devices, good speed, no evidence for imbalance, normal gait pattern, deviates no more than 6 in outside of the 12 in walkway width.  Change in Gait Speed Able to change speed, demonstrates mild gait deviations, deviates 6-10 in outside of the 12 in walkway width, or no gait deviations, unable to achieve a major change in velocity, or uses a change in velocity, or uses an assistive device.    Gait with Horizontal Head Turns Performs head turns smoothly with slight change in gait velocity (eg, minor disruption to smooth gait path), deviates 6-10 in outside 12 in walkway width, or uses an assistive device.    Gait with Vertical Head Turns Performs task with slight change in  gait velocity (eg, minor disruption to smooth gait path), deviates 6 - 10 in outside 12 in walkway width or uses assistive device    Gait and Pivot Turn Pivot turns safely in greater than 3 sec and stops with no loss of balance, or pivot turns safely within 3 sec and stops with mild imbalance, requires small steps to catch balance.    Step Over Obstacle Is able to step over 2 stacked shoe boxes taped together (9 in total height) without changing gait speed. No evidence of imbalance.    Gait with Narrow Base of Support Is able to ambulate for 10 steps heel to toe with no staggering.    Gait with Eyes Closed Walks 20 ft, no assistive devices, good speed, no evidence of imbalance, normal gait pattern, deviates no more than 6 in outside 12 in walkway width. Ambulates 20 ft in less than 7 sec.    Ambulating Backwards Walks 20 ft, uses assistive device, slower speed, mild gait deviations, deviates 6-10 in outside 12 in walkway width.    Steps Alternating feet, no rail.    Total Score 25               PATIENT EDUCATION: Education details: PT DC POC Person educated: Patient Education method: Explanation Education comprehension: verbalized understanding  HOME EXERCISE PROGRAM:  Access Code: [email or web address]   GOALS: Goals reviewed with patient? Yes   SHORT TERM GOALS = LONG TERM GOALS   LONG TERM GOALS: Target date: 11/27/2021     Pt will be independent with final and progressive HEP for improved balance and vestibular input  Baseline: established Goal status: MET   2.  Pt will improve FGA by 4 points from baseline to demonstrate improved balance and reduced fall risk Baseline: 17/30; 25/30 Goal status: MET   3.  Pt will report </= 1/5 for all movements on MSQ to indicate improvement in motion sensitivity and improved activity tolerance.  Baseline: 2/5; 2/5 Goal status: NOT MET   4.  Pt will be able to tolerate >/= 60 seconds of VOR with </= 3/10 dizziness  Baseline: moderate dizziness;  3/10 Goal status: MET   5.  Pt will improve DFS to >/= 59% Baseline: 48% ; 48.8%           Goal status: NOT MET   ASSESSMENT:   CLINICAL IMPRESSION: Patient seen for skilled PT session with emphasis on goal assessment for dc from PT. He met 3/5 LTG with improvements in outcome measures, but minimal improvement in self-report measures. Patient scored a 25/30 on the Functional Gait Assessment.   <22/30 = predictive of falls, <20/30 = fall in 6 months, <18/30 = predictive of falls in PD MCID: 5 points stroke population, 4 points geriatric population (ANPTA Core Set of Outcome Measures for Adults with Neurologic Conditions, 2018). He continues to report dizziness with fast movements- PT advising patient to move slower to minimize experience  of dizziness and limit risk for falling. Patient verbalized understanding and is agreeable to dc.    OBJECTIVE IMPAIRMENTS decreased activity tolerance, decreased balance, and dizziness.    ACTIVITY LIMITATIONS locomotion level   PARTICIPATION LIMITATIONS: occupation and yard work   PERSONAL FACTORS Age and 1-2 comorbidities: HTN, Lumbar Radiculopathy, CKD Stage 2, HLD, Bradycardia, OA, GERD  are also affecting patient's functional outcome.    REHAB POTENTIAL: Excellent   CLINICAL DECISION MAKING: Stable/uncomplicated   EVALUATION COMPLEXITY: Low     PLAN: PT FREQUENCY: 1x/week   PT DURATION: 6 weeks (may only need 4 weeks)   PLANNED INTERVENTIONS: Therapeutic exercises, Therapeutic activity, Neuromuscular re-education, Balance training, Gait training, Patient/Family education, Joint manipulation, Joint mobilization, Stair training, Vestibular training, Canalith repositioning, Cryotherapy, and Moist heat   PLAN FOR NEXT SESSION: PT DC POC   Debbora Dus, PT, DPT Debbora Dus, PT, DPT, CBIS  11/20/2021, 1:51 PM

## 2021-12-02 ENCOUNTER — Telehealth: Payer: Self-pay | Admitting: Physical Medicine and Rehabilitation

## 2021-12-02 NOTE — Telephone Encounter (Signed)
Pt called to set an appt for back injection. Please call pt at [phone].

## 2021-12-24 ENCOUNTER — Ambulatory Visit: Payer: Self-pay

## 2021-12-24 ENCOUNTER — Encounter: Payer: Self-pay | Admitting: Physical Medicine and Rehabilitation

## 2021-12-24 ENCOUNTER — Ambulatory Visit (INDEPENDENT_AMBULATORY_CARE_PROVIDER_SITE_OTHER): Payer: Medicare HMO | Admitting: Physical Medicine and Rehabilitation

## 2021-12-24 VITALS — BP 126/75 | HR 43

## 2021-12-24 DIAGNOSIS — M5416 Radiculopathy, lumbar region: Secondary | ICD-10-CM

## 2021-12-24 MED ORDER — METHYLPREDNISOLONE ACETATE 80 MG/ML IJ SUSP
80.0000 mg | Freq: Once | INTRAMUSCULAR | Status: AC
  Administered 2021-12-24: 80 mg

## 2021-12-24 NOTE — Patient Instructions (Signed)

## 2021-12-24 NOTE — Progress Notes (Signed)
Pt state lower back pain that travels to his right knee. 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 Pt state he takes pain meds to help ease his pain.  Numeric Pain Rating Scale and Functional Assessment Average Pain 7   In the last MONTH (on 0-10 scale) has pain interfered with the following?  1. General activity like being  able to carry out your everyday physical activities such as walking, climbing stairs, carrying groceries, or moving a chair?  Rating(10)   +Driver, -BT, -Dye Allergies.

## 2022-01-02 NOTE — Procedures (Signed)
Lumbosacral Transforaminal Epidural Steroid Injection - Sub-Pedicular Approach with Fluoroscopic Guidance  Patient: Seth Cox      Date of Birth: 1951/03/10 MRN: [id number] PCP: [name], MD      Visit Date: 12/24/2021   Universal Protocol:    Date/Time: 12/24/2021  Consent Given By: the patient  Position: PRONE  Additional Comments: Vital signs were monitored before and after the procedure. Patient was prepped and draped in the usual sterile fashion. The correct patient, procedure, and site was verified.   Injection Procedure Details:   Procedure diagnoses: Lumbar radiculopathy [M54.16]    Meds Administered:  Meds ordered this encounter  Medications   methylPREDNISolone acetate (DEPO-MEDROL) injection 80 mg    Laterality: Right  Location/Site: L4  Needle:5.0 in., 22 ga.  Short bevel or Quincke spinal needle  Needle Placement: Transforaminal  Findings:    -Comments: Excellent flow of contrast along the nerve, nerve root and into the epidural space.  Procedure Details: After squaring off the end-plates to get a true AP view, the C-arm was positioned so that an oblique view of the foramen as noted above was visualized. The target area is just inferior to the "nose of the scotty dog" or sub pedicular. The soft tissues overlying this structure were infiltrated with 2-3 ml. of 1% Lidocaine without Epinephrine.  The spinal needle was inserted toward the target using a "trajectory" view along the fluoroscope beam.  Under AP and lateral visualization, the needle was advanced so it did not puncture dura and was located close the 6 O'Clock position of the pedical in AP tracterory. Biplanar projections were used to confirm position. Aspiration was confirmed to be negative for CSF and/or blood. A 1-2 ml. volume of Isovue-250 was injected and flow of contrast was noted at each level. Radiographs were obtained for documentation purposes.   After attaining the desired  flow of contrast documented above, a 0.5 to 1.0 ml test dose of 0.25% Marcaine was injected into each respective transforaminal space.  The patient was observed for 90 seconds post injection.  After no sensory deficits were reported, and normal lower extremity motor function was noted,   the above injectate was administered so that equal amounts of the injectate were placed at each foramen (level) into the transforaminal epidural space.   Additional Comments:  The patient tolerated the procedure well Dressing: 2 x 2 sterile gauze and Band-Aid    Post-procedure details: Patient was observed during the procedure. Post-procedure instructions were reviewed.  Patient left the clinic in stable condition.

## 2022-01-02 NOTE — Progress Notes (Signed)
Seth Cox - 71 y.o. male MRN [id number]  Date of birth: 02/05/1951  Office Visit Note: Visit Date: 12/24/2021 PCP: [name], MD Referred by: [name], MD  Subjective: Chief Complaint  Patient presents with   Lower Back - Pain   Right Knee - Pain   HPI:  [name] is a 71 y.o. male who comes in today for planned repeat Right L4-5  Lumbar Transforaminal epidural steroid injection with fluoroscopic guidance.  The patient has failed conservative care including home exercise, medications, time and activity modification.  This injection will be diagnostic and hopefully therapeutic.  Please see requesting physician notes for further details and justification. Patient received more than 50% pain relief from prior injection.   Referring: Barnie Del, FNP   ROS Otherwise per HPI.  Assessment & Plan: Visit Diagnoses:    ICD-10-CM   1. Lumbar radiculopathy  M54.16 XR C-ARM NO REPORT    Epidural Steroid injection    methylPREDNISolone acetate (DEPO-MEDROL) injection 80 mg      Plan: No additional findings.   Meds & Orders:  Meds ordered this encounter  Medications   methylPREDNISolone acetate (DEPO-MEDROL) injection 80 mg    Orders Placed This Encounter  Procedures   XR C-ARM NO REPORT   Epidural Steroid injection    Follow-up: Return for visit to requesting provider as needed.   Procedures: No procedures performed  Lumbosacral Transforaminal Epidural Steroid Injection - Sub-Pedicular Approach with Fluoroscopic Guidance  Patient: Seth Cox      Date of Birth: [birth year] MRN: [id number] PCP: [name], MD      Visit Date: 12/24/2021   Universal Protocol:    Date/Time: 12/24/2021  Consent Given By: the patient  Position: PRONE  Additional Comments: Vital signs were monitored before and after the procedure. Patient was prepped and draped in the usual sterile fashion. The correct patient, procedure, and site was  verified.   Injection Procedure Details:   Procedure diagnoses: Lumbar radiculopathy [M54.16]    Meds Administered:  Meds ordered this encounter  Medications   methylPREDNISolone acetate (DEPO-MEDROL) injection 80 mg    Laterality: Right  Location/Site: L4  Needle:5.0 in., 22 ga.  Short bevel or Quincke spinal needle  Needle Placement: Transforaminal  Findings:    -Comments: Excellent flow of contrast along the nerve, nerve root and into the epidural space.  Procedure Details: After squaring off the end-plates to get a true AP view, the C-arm was positioned so that an oblique view of the foramen as noted above was visualized. The target area is just inferior to the "nose of the scotty dog" or sub pedicular. The soft tissues overlying this structure were infiltrated with 2-3 ml. of 1% Lidocaine without Epinephrine.  The spinal needle was inserted toward the target using a "trajectory" view along the fluoroscope beam.  Under AP and lateral visualization, the needle was advanced so it did not puncture dura and was located close the 6 O'Clock position of the pedical in AP tracterory. Biplanar projections were used to confirm position. Aspiration was confirmed to be negative for CSF and/or blood. A 1-2 ml. volume of Isovue-250 was injected and flow of contrast was noted at each level. Radiographs were obtained for documentation purposes.   After attaining the desired flow of contrast documented above, a 0.5 to 1.0 ml test dose of 0.25% Marcaine was injected into each respective transforaminal space.  The patient was observed for 90 seconds post injection.  After no  sensory deficits were reported, and normal lower extremity motor function was noted,   the above injectate was administered so that equal amounts of the injectate were placed at each foramen (level) into the transforaminal epidural space.   Additional Comments:  The patient tolerated the procedure well Dressing: 2 x 2 sterile  gauze and Band-Aid    Post-procedure details: Patient was observed during the procedure. Post-procedure instructions were reviewed.  Patient left the clinic in stable condition.    Clinical History: No specialty comments available.     Objective:  VS:  HT:    WT:   BMI:     BP:126/75  HR:(!) 43bpm  TEMP: ( )  RESP:  Physical Exam Vitals and nursing note reviewed.  Constitutional:      General: He is not in acute distress.    Appearance: Normal appearance. He is not ill-appearing.  HENT:     Head: Normocephalic and atraumatic.     Right Ear: External ear normal.     Left Ear: External ear normal.     Nose: No congestion.  Eyes:     Extraocular Movements: Extraocular movements intact.  Cardiovascular:     Rate and Rhythm: Normal rate.     Pulses: Normal pulses.  Pulmonary:     Effort: Pulmonary effort is normal. No respiratory distress.  Abdominal:     General: There is no distension.     Palpations: Abdomen is soft.  Musculoskeletal:        General: No tenderness or signs of injury.     Cervical back: Neck supple.     Right lower leg: No edema.     Left lower leg: No edema.     Comments: Patient has good distal strength without clonus.  Skin:    Findings: No erythema or rash.  Neurological:     General: No focal deficit present.     Mental Status: He is alert and oriented to person, place, and time.     Sensory: No sensory deficit.     Motor: No weakness or abnormal muscle tone.     Coordination: Coordination normal.  Psychiatric:        Mood and Affect: Mood normal.        Behavior: Behavior normal.      Imaging: No results found.

## 2022-02-01 ENCOUNTER — Other Ambulatory Visit: Payer: Self-pay | Admitting: Internal Medicine

## 2022-02-01 DIAGNOSIS — Z122 Encounter for screening for malignant neoplasm of respiratory organs: Secondary | ICD-10-CM

## 2022-02-18 ENCOUNTER — Ambulatory Visit
Admission: RE | Admit: 2022-02-18 | Discharge: 2022-02-18 | Disposition: A | Discharge status: Home / Self Care | Payer: Medicare HMO | Source: Ambulatory Visit | Attending: Internal Medicine | Admitting: Internal Medicine

## 2022-02-18 DIAGNOSIS — Z122 Encounter for screening for malignant neoplasm of respiratory organs: Secondary | ICD-10-CM

## 2022-02-22 ENCOUNTER — Encounter: Payer: Self-pay | Admitting: Orthopedic Surgery

## 2022-02-22 ENCOUNTER — Ambulatory Visit: Payer: Medicare HMO | Admitting: Orthopedic Surgery

## 2022-02-22 DIAGNOSIS — G8929 Other chronic pain: Secondary | ICD-10-CM

## 2022-02-22 DIAGNOSIS — M25561 Pain in right knee: Secondary | ICD-10-CM

## 2022-02-22 DIAGNOSIS — M5416 Radiculopathy, lumbar region: Secondary | ICD-10-CM

## 2022-02-22 NOTE — Progress Notes (Signed)
Office Visit Note   Patient: Seth Cox           Date of Birth: Aug 04, 1950           MRN: [id number] Visit Date: 02/22/2022              Requested by: [name], MD [address] [place],  [place] [address] PCP: [name], MD  Chief Complaint  Patient presents with   Right Knee - Follow-up      HPI: Patient is a 72 year old gentleman who presents in follow-up for lower back and right knee pain.  Patient states his knee did improve with the injection but does have crepitation and popping which is not painful.  Patient states that the steroid injection did resolve the radicular symptoms but he states he still has some lower back pain that was not resolved with the second injection.  Assessment & Plan: Visit Diagnoses:  1. Lumbar radiculopathy   2. Chronic pain of right knee     Plan: Recommended exercise and strengthening.  Follow-up as needed if his right knee symptoms recur.  Follow-Up Instructions: Return if symptoms worsen or fail to improve.   Ortho Exam  Patient is alert, oriented, no adenopathy, well-dressed, normal affect, normal respiratory effort. Examination the right knee there is no effusion he does have crepitation with range of motion.  Patient has no radicular symptoms but still has some lower back pain.  No focal motor weakness.  Imaging: No results found. No images are attached to the encounter.  Labs: No results found for: "HGBA1C", "ESRSEDRATE", "CRP", "LABURIC", "REPTSTATUS", "GRAMSTAIN", "CULT", "LABORGA"   No results found for: "ALBUMIN", "PREALBUMIN", "CBC"  No results found for: "MG" No results found for: "VD25OH"  No results found for: "PREALBUMIN"    Latest Ref Rng & Units 01/09/2010   10:07 PM  CBC EXTENDED  Hemoglobin 13.0 - 17.0 g/dL 69.6   HCT 78.9 - 38.1 % 50.0      There is no height or weight on file to calculate BMI.  Orders:  No orders of the defined types were placed in this  encounter.  No orders of the defined types were placed in this encounter.    Procedures: No procedures performed  Clinical Data: No additional findings.  ROS:  All other systems negative, except as noted in the HPI. Review of Systems  Objective: Vital Signs: There were no vitals taken for this visit.  Specialty Comments:  No specialty comments available.  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

## 2022-04-20 ENCOUNTER — Telehealth: Payer: Self-pay | Admitting: Physical Medicine and Rehabilitation

## 2022-04-20 ENCOUNTER — Other Ambulatory Visit: Payer: Self-pay | Admitting: Physical Medicine and Rehabilitation

## 2022-04-20 DIAGNOSIS — M5416 Radiculopathy, lumbar region: Secondary | ICD-10-CM

## 2022-04-20 NOTE — Telephone Encounter (Signed)
Pt walk in office requesting to schedule an appointment for pain in back... Pt requesting for an appt next week or week after...  Pt requesting a callback.Seth KitchenMarland Cox

## 2022-04-20 NOTE — Telephone Encounter (Signed)
Spoke with patient and he is having the same type of pain on the right side of lower back. He stated the last injection last maybe 2 months but the first one lasted longer than that. He is wanting a repeat injection. No new injuries, falls or accidents. Please advise

## 2022-05-18 ENCOUNTER — Ambulatory Visit: Payer: Medicare HMO | Admitting: Physical Medicine and Rehabilitation

## 2022-05-18 ENCOUNTER — Ambulatory Visit: Payer: Self-pay

## 2022-05-18 VITALS — BP 128/72 | HR 47

## 2022-05-18 DIAGNOSIS — M5416 Radiculopathy, lumbar region: Secondary | ICD-10-CM | POA: Diagnosis not present

## 2022-05-18 MED ORDER — METHYLPREDNISOLONE ACETATE 80 MG/ML IJ SUSP
80.0000 mg | Freq: Once | INTRAMUSCULAR | Status: AC
  Administered 2022-05-18: 80 mg

## 2022-05-18 NOTE — Patient Instructions (Signed)

## 2022-05-18 NOTE — Progress Notes (Signed)
Functional Pain Scale - descriptive words and definitions  Mild (2)   Noticeable when not distracted/no impact on ADL's/sleep only slightly affected and able to   use both passive and active distraction for comfort. Mild range order  Average Pain 5   +Driver, -BT, -Dye Allergies.  Right sided low back pain, but moves to the left side at times. Can radiate into leg if walking alot

## 2022-05-23 NOTE — Procedures (Signed)
Lumbosacral Transforaminal Epidural Steroid Injection - Sub-Pedicular Approach with Fluoroscopic Guidance  Patient: Seth Cox      Date of Birth: September 04, 1950 MRN: [id number] PCP: Audley Hose, MD      Visit Date: 05/18/2022   Universal Protocol:    Date/Time: 05/18/2022  Consent Given By: the patient  Position: PRONE  Additional Comments: Vital signs were monitored before and after the procedure. Patient was prepped and draped in the usual sterile fashion. The correct patient, procedure, and site was verified.   Injection Procedure Details:   Procedure diagnoses: Lumbar radiculopathy [M54.16]    Meds Administered:  Meds ordered this encounter  Medications   methylPREDNISolone acetate (DEPO-MEDROL) injection 80 mg    Laterality: Right  Location/Site: L4  Needle:5.0 in., 22 ga.  Short bevel or Quincke spinal needle  Needle Placement: Transforaminal  Findings:    -Comments: Excellent flow of contrast along the nerve, nerve root and into the epidural space.  Procedure Details: After squaring off the end-plates to get a true AP view, the C-arm was positioned so that an oblique view of the foramen as noted above was visualized. The target area is just inferior to the "nose of the scotty dog" or sub pedicular. The soft tissues overlying this structure were infiltrated with 2-3 ml. of 1% Lidocaine without Epinephrine.  The spinal needle was inserted toward the target using a "trajectory" view along the fluoroscope beam.  Under AP and lateral visualization, the needle was advanced so it did not puncture dura and was located close the 6 O'Clock position of the pedical in AP tracterory. Biplanar projections were used to confirm position. Aspiration was confirmed to be negative for CSF and/or blood. A 1-2 ml. volume of Isovue-250 was injected and flow of contrast was noted at each level. Radiographs were obtained for documentation purposes.   After attaining the desired  flow of contrast documented above, a 0.5 to 1.0 ml test dose of 0.25% Marcaine was injected into each respective transforaminal space.  The patient was observed for 90 seconds post injection.  After no sensory deficits were reported, and normal lower extremity motor function was noted,   the above injectate was administered so that equal amounts of the injectate were placed at each foramen (level) into the transforaminal epidural space.   Additional Comments:  No complications occurred Dressing: 2 x 2 sterile gauze and Band-Aid    Post-procedure details: Patient was observed during the procedure. Post-procedure instructions were reviewed.  Patient left the clinic in stable condition.

## 2022-05-23 NOTE — Progress Notes (Signed)
BRODI KARI - 72 y.o. male MRN [id number]  Date of birth: July 22, 1950  Office Visit Note: Visit Date: 05/18/2022 PCP: Audley Hose, MD Referred by: Lorine Bears, NP  Subjective: Chief Complaint  Patient presents with   Lower Back - Pain   HPI:  DELROY Cox is a 72 y.o. male who comes in today for planned repeat Right L4-5  Lumbar Transforaminal epidural steroid injection with fluoroscopic guidance.  The patient has failed conservative care including home exercise, medications, time and activity modification.  This injection will be diagnostic and hopefully therapeutic.  Please see requesting physician notes for further details and justification. Patient received more than 50% pain relief from prior injection.   Referring: Barnet Pall, FNP and Dr. Meridee Score   ROS Otherwise per HPI.  Assessment & Plan: Visit Diagnoses:    ICD-10-CM   1. Lumbar radiculopathy  M54.16 XR C-ARM NO REPORT    Epidural Steroid injection    methylPREDNISolone acetate (DEPO-MEDROL) injection 80 mg      Plan: No additional findings.   Meds & Orders:  Meds ordered this encounter  Medications   methylPREDNISolone acetate (DEPO-MEDROL) injection 80 mg    Orders Placed This Encounter  Procedures   XR C-ARM NO REPORT   Epidural Steroid injection    Follow-up: Return for visit to requesting provider as needed.   Procedures: No procedures performed  Lumbosacral Transforaminal Epidural Steroid Injection - Sub-Pedicular Approach with Fluoroscopic Guidance  Patient: Seth Cox      Date of Birth: 24-Dec-1950 MRN: [id number] PCP: Audley Hose, MD      Visit Date: 05/18/2022   Universal Protocol:    Date/Time: 05/18/2022  Consent Given By: the patient  Position: PRONE  Additional Comments: Vital signs were monitored before and after the procedure. Patient was prepped and draped in the usual sterile fashion. The correct patient, procedure, and site was  verified.   Injection Procedure Details:   Procedure diagnoses: Lumbar radiculopathy [M54.16]    Meds Administered:  Meds ordered this encounter  Medications   methylPREDNISolone acetate (DEPO-MEDROL) injection 80 mg    Laterality: Right  Location/Site: L4  Needle:5.0 in., 22 ga.  Short bevel or Quincke spinal needle  Needle Placement: Transforaminal  Findings:    -Comments: Excellent flow of contrast along the nerve, nerve root and into the epidural space.  Procedure Details: After squaring off the end-plates to get a true AP view, the C-arm was positioned so that an oblique view of the foramen as noted above was visualized. The target area is just inferior to the "nose of the scotty dog" or sub pedicular. The soft tissues overlying this structure were infiltrated with 2-3 ml. of 1% Lidocaine without Epinephrine.  The spinal needle was inserted toward the target using a "trajectory" view along the fluoroscope beam.  Under AP and lateral visualization, the needle was advanced so it did not puncture dura and was located close the 6 O'Clock position of the pedical in AP tracterory. Biplanar projections were used to confirm position. Aspiration was confirmed to be negative for CSF and/or blood. A 1-2 ml. volume of Isovue-250 was injected and flow of contrast was noted at each level. Radiographs were obtained for documentation purposes.   After attaining the desired flow of contrast documented above, a 0.5 to 1.0 ml test dose of 0.25% Marcaine was injected into each respective transforaminal space.  The patient was observed for 90 seconds post injection.  After no sensory deficits  were reported, and normal lower extremity motor function was noted,   the above injectate was administered so that equal amounts of the injectate were placed at each foramen (level) into the transforaminal epidural space.   Additional Comments:  No complications occurred Dressing: 2 x 2 sterile gauze and  Band-Aid    Post-procedure details: Patient was observed during the procedure. Post-procedure instructions were reviewed.  Patient left the clinic in stable condition.    Clinical History: MRI LUMBAR SPINE WITHOUT CONTRAST    TECHNIQUE:  Multiplanar, multisequence MR imaging of the lumbar spine was  performed. No intravenous contrast was administered.    COMPARISON:  Lumbar radiographs 05/05/2020.    Chest radiographs 1017.    FINDINGS:  Segmentation: 12 full size ribs in 2007. 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  Correlation with radiographs is recommended prior to any operative  intervention.    Alignment: Straightening of lumbar lordosis with subtle  anterolisthesis at both L4-L5 and L5-S1.    Vertebrae: No marrow edema or evidence of acute osseous abnormality.  Visualized bone marrow signal is within normal limits. Intact  visible sacrum and SI joints.    Conus medullaris and cauda equina: Conus extends to the T12-L1  level. No lower spinal cord or conus signal abnormality.    Paraspinal and other soft tissues: Negative.    Disc levels:    T12-L1:  Negative.    L1-L2:  Negative.    L2-L3:  Subtle disc bulging.  No stenosis.    L3-L4: Mild circumferential disc bulge eccentric to the left. Mild  facet and moderate ligament flavum hypertrophy. Borderline to mild  spinal stenosis, left L3 foraminal stenosis.    L4-L5: Disc desiccation. Circumferential disc bulge. Moderate facet  and ligament flavum hypertrophy. Mild spinal and bilateral lateral  recess stenosis (L5 nerve levels). Endplate spurring contributes to  moderate bilateral L4 foraminal stenosis.    L5-S1: Disc desiccation, circumferential disc bulge and endplate  spurring. Small left subarticular annular fissure of the disc  (series 3, image 10). Moderate facet hypertrophy. No spinal  stenosis. Mild bilateral lateral recess stenosis (S1 nerve  levels).  Mild bilateral L5 foraminal stenosis.    S1-S2: Mostly lumbarized, negative.    IMPRESSION:  1. 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 Correlation with radiographs is  recommended prior to any operative intervention.    2. Subtle anterolisthesis at both L4-L5 and L5-S1 with disc bulging  and moderate posterior element hypertrophy. Mild spinal stenosis at  the former. Mild bilateral lateral recess stenosis at both levels.  Moderate bilateral L4 neural foraminal stenosis.    3. Similar multifactorial borderline to mild spinal and foraminal  stenosis at L3-L4.      Electronically Signed    By: Odessa Fleming M.D.    On: 05/29/2020 07:44     Objective:  VS:  HT:    WT:   BMI:     BP:128/72  HR:(!) 47bpm  TEMP: ( )  RESP:  Physical Exam Vitals and nursing note reviewed.  Constitutional:      General: He is not in acute distress.    Appearance: Normal appearance. He is not ill-appearing.  HENT:     Head: Normocephalic and atraumatic.     Right Ear: External ear normal.     Left Ear: External ear normal.     Nose: No congestion.  Eyes:  Extraocular Movements: Extraocular movements intact.  Cardiovascular:     Rate and Rhythm: Normal rate.     Pulses: Normal pulses.  Pulmonary:     Effort: Pulmonary effort is normal. No respiratory distress.  Abdominal:     General: There is no distension.     Palpations: Abdomen is soft.  Musculoskeletal:        General: No tenderness or signs of injury.     Cervical back: Neck supple.     Right lower leg: No edema.     Left lower leg: No edema.     Comments: Patient has good distal strength without clonus.  Skin:    Findings: No erythema or rash.  Neurological:     General: No focal deficit present.     Mental Status: He is alert and oriented to person, place, and time.     Sensory: No sensory deficit.     Motor: No weakness or abnormal muscle tone.      Coordination: Coordination normal.  Psychiatric:        Mood and Affect: Mood normal.        Behavior: Behavior normal.      Imaging: No results found.

## 2022-06-03 ENCOUNTER — Telehealth: Payer: Self-pay | Admitting: Physical Medicine and Rehabilitation

## 2022-06-03 NOTE — Telephone Encounter (Signed)
FYI.Marland KitchenMarland KitchenMarland KitchenMarland KitchenPatient called in stating his back is feeling a little better than it was before the injection but he still cannot get back to his regular activities like yard work and he also cannot walk too far.

## 2022-06-03 NOTE — Telephone Encounter (Signed)
scheduled

## 2022-06-04 ENCOUNTER — Ambulatory Visit (INDEPENDENT_AMBULATORY_CARE_PROVIDER_SITE_OTHER): Payer: Medicare HMO | Admitting: Physical Medicine and Rehabilitation

## 2022-06-04 DIAGNOSIS — G8929 Other chronic pain: Secondary | ICD-10-CM | POA: Diagnosis not present

## 2022-06-04 DIAGNOSIS — M545 Low back pain, unspecified: Secondary | ICD-10-CM

## 2022-06-04 DIAGNOSIS — M47816 Spondylosis without myelopathy or radiculopathy, lumbar region: Secondary | ICD-10-CM

## 2022-06-04 NOTE — Progress Notes (Signed)
Functional Pain Scale - descriptive words and definitions  Unmanageable (7)  Pain interferes with normal ADL's/nothing seems to help/sleep is very difficult/active distractions are very difficult to concentrate on. Severe range order  Average Pain 5Right sided lower back pain , no radiculopathy . Getting worse over time.

## 2022-06-04 NOTE — Progress Notes (Signed)
Seth Cox - 72 y.o. male MRN [id number]  Date of birth: 27-Aug-1950  Office Visit Note: Visit Date: 06/04/2022 PCP: Audley Hose, MD Referred by: Audley Hose, MD  Subjective: Chief Complaint  Patient presents with   Lower Back - Pain   HPI: Seth Cox is a 72 y.o. male who comes in today for evaluation of chronic, worsening and severe bilateral lower back pain, right greater than left. Recent right L4 transforaminal epidural steroid injection on 05/18/2022 performed in our office did help resolve right leg symptoms, however lower back pain is now severe. Pain ongoing for several years and worsens with standing and house hold tasks.  Patient states he enjoys playing golf but has not been able to play lately as his pain does increase with twisting.  He describes pain as sore and aching, currently rates as 8 out of 10. Some relief of pain with home exercise regimen, rest and use of medications. Patient recently completed regimen of formal physical therapy more for issues with dizziness/vertigo. Lumbar MRI imaging from 2022 exhibits lumbarized S1, subtle anterolisthesis at both L4-L5 and L5-S1 with moderate facet arthropathy. There is also mild spinal and bilateral lateral recess stenosis at L4-L5. Patient denies focal weakness, numbness and tingling. Patient denies recent trauma or falls.     Review of Systems  Musculoskeletal:  Positive for back pain.  Neurological:  Negative for tingling, sensory change, focal weakness and weakness.  All other systems reviewed and are negative.  Otherwise per HPI.  Assessment & Plan: Visit Diagnoses:    ICD-10-CM   1. Chronic bilateral low back pain without sciatica  M54.50 Ambulatory referral to Physical Medicine Rehab   G89.29     2. Spondylosis without myelopathy or radiculopathy, lumbar region  M47.816 Ambulatory referral to Physical Medicine Rehab    3. Facet arthropathy, lumbar  M47.816 Ambulatory referral to Physical  Medicine Rehab       Plan: Findings:  Chronic, worsening and severe bilateral axial back pain, right greater than left.  No radicular symptoms noted.  Significant and sustained relief of pain with recent lumbar epidural steroid injection, however lower back pain remains severe.  Patient continues with conservative therapies such as home exercise regimen and use of medications.  Patient's clinical presentation and exam are consistent with facet mediated pain.  Severe pain noted with lumbar extension upon exam today.  Next step is to perform diagnostic and hopefully therapeutic bilateral L4-L5 and L5-S1 facet joint blocks under fluoroscopic guidance.  If good relief of pain with facet joint blocks we did discuss possibility of longer sustained pain relief with radiofrequency ablation procedure.  Should his right-sided radicular symptoms return we would consider repeating lumbar epidural steroid injection infrequently as needed. No red flag symptoms noted upon exam today.     Meds & Orders: No orders of the defined types were placed in this encounter.   Orders Placed This Encounter  Procedures   Ambulatory referral to Physical Medicine Rehab    Follow-up: Return for Bilateral L4-L5 and L5-S1 facet blocks.   Procedures: No procedures performed      Clinical History: MRI LUMBAR SPINE WITHOUT CONTRAST    TECHNIQUE:  Multiplanar, multisequence MR imaging of the lumbar spine was  performed. No intravenous contrast was administered.    COMPARISON:  Lumbar radiographs 05/05/2020.    Chest radiographs 1017.    FINDINGS:  Segmentation: 12 full size ribs in 2007. 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  Correlation with radiographs is recommended prior to any operative  intervention.    Alignment: Straightening of lumbar lordosis with subtle  anterolisthesis at both L4-L5 and L5-S1.    Vertebrae: No marrow edema or evidence of acute  osseous abnormality.  Visualized bone marrow signal is within normal limits. Intact  visible sacrum and SI joints.    Conus medullaris and cauda equina: Conus extends to the T12-L1  level. No lower spinal cord or conus signal abnormality.    Paraspinal and other soft tissues: Negative.    Disc levels:    T12-L1:  Negative.    L1-L2:  Negative.    L2-L3:  Subtle disc bulging.  No stenosis.    L3-L4: Mild circumferential disc bulge eccentric to the left. Mild  facet and moderate ligament flavum hypertrophy. Borderline to mild  spinal stenosis, left L3 foraminal stenosis.    L4-L5: Disc desiccation. Circumferential disc bulge. Moderate facet  and ligament flavum hypertrophy. Mild spinal and bilateral lateral  recess stenosis (L5 nerve levels). Endplate spurring contributes to  moderate bilateral L4 foraminal stenosis.    L5-S1: Disc desiccation, circumferential disc bulge and endplate  spurring. Small left subarticular annular fissure of the disc  (series 3, image 10). Moderate facet hypertrophy. No spinal  stenosis. Mild bilateral lateral recess stenosis (S1 nerve levels).  Mild bilateral L5 foraminal stenosis.    S1-S2: Mostly lumbarized, negative.    IMPRESSION:  1. 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 Correlation with radiographs is  recommended prior to any operative intervention.    2. Subtle anterolisthesis at both L4-L5 and L5-S1 with disc bulging  and moderate posterior element hypertrophy. Mild spinal stenosis at  the former. Mild bilateral lateral recess stenosis at both levels.  Moderate bilateral L4 neural foraminal stenosis.    3. Similar multifactorial borderline to mild spinal and foraminal  stenosis at L3-L4.      Electronically Signed    By: Odessa Fleming M.D.    On: 05/29/2020 07:44   He reports that he has quit smoking. His smoking use included cigarettes. He smoked an average of 1 pack per day.  He has never used smokeless tobacco. No results for input(s): "HGBA1C", "LABURIC" in the last 8760 hours.  Objective:  VS:  HT:    WT:   BMI:     BP:   HR: bpm  TEMP: ( )  RESP:  Physical Exam Vitals and nursing note reviewed.  HENT:     Head: Normocephalic and atraumatic.     Right Ear: External ear normal.     Left Ear: External ear normal.     Nose: Nose normal.     Mouth/Throat:     Mouth: Mucous membranes are moist.  Eyes:     Extraocular Movements: Extraocular movements intact.  Cardiovascular:     Rate and Rhythm: Normal rate.     Pulses: Normal pulses.  Pulmonary:     Effort: Pulmonary effort is normal.  Abdominal:     General: Abdomen is flat. There is no distension.  Musculoskeletal:        General: Tenderness present.     Cervical back: Normal range of motion.     Comments: Pt rises from seated position to standing without difficulty. Concordant low back pain with facet loading, lumbar spine extension and rotation. Strong distal strength without clonus, no pain upon palpation of greater trochanters. Sensation intact bilaterally. Walks independently,  gait steady.   Skin:    General: Skin is warm and dry.     Capillary Refill: Capillary refill takes less than 2 seconds.  Neurological:     General: No focal deficit present.     Mental Status: He is alert.  Psychiatric:        Mood and Affect: Mood normal.        Behavior: Behavior normal.     Ortho Exam  Imaging: No results found.  Past Medical/Family/Surgical/Social History: Medications & Allergies reviewed per EMR, new medications updated. 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

## 2022-06-05 ENCOUNTER — Encounter: Payer: Self-pay | Admitting: Physical Medicine and Rehabilitation

## 2022-06-08 ENCOUNTER — Telehealth: Payer: Self-pay | Admitting: Physical Medicine and Rehabilitation

## 2022-06-08 NOTE — Telephone Encounter (Signed)
Patient states he is still waiting on a call for his injection. Please advise

## 2022-06-08 NOTE — Telephone Encounter (Signed)
Spoke with patient and scheduled for 06/14/22 

## 2022-06-14 ENCOUNTER — Ambulatory Visit: Payer: Medicare HMO | Admitting: Physical Medicine and Rehabilitation

## 2022-06-14 ENCOUNTER — Ambulatory Visit: Payer: Self-pay

## 2022-06-14 VITALS — BP 125/76 | HR 46

## 2022-06-14 DIAGNOSIS — M47816 Spondylosis without myelopathy or radiculopathy, lumbar region: Secondary | ICD-10-CM

## 2022-06-14 MED ORDER — METHYLPREDNISOLONE ACETATE 80 MG/ML IJ SUSP
80.0000 mg | Freq: Once | INTRAMUSCULAR | Status: AC
  Administered 2022-06-14: 80 mg

## 2022-06-14 NOTE — Progress Notes (Unsigned)
Functional Pain Scale - descriptive words and definitions  Uncomfortable (3)  Pain is present but can complete all ADL's/sleep is slightly affected and passive distraction only gives marginal relief. Mild range order  Average Pain  varies   +Driver, -BT, -Dye Allergies.  Bilateral lower back pain with occasional radiation into legs

## 2022-06-14 NOTE — Patient Instructions (Signed)

## 2022-06-16 NOTE — Progress Notes (Signed)
Seth Cox - 72 y.o. male MRN [id number]  Date of birth: 01/07/1951  Office Visit Note: Visit Date: 06/14/2022 PCP: Audley Hose, MD Referred by: Audley Hose, MD  Subjective: Chief Complaint  Patient presents with   Lower Back - Pain   HPI:  Seth Cox is a 72 y.o. male who comes in today at the request of Barnet Pall, FNP for planned Bilateral  L4-5 and L5-S1 Lumbar facet/medial branch block with fluoroscopic guidance.  The patient has failed conservative care including home exercise, medications, time and activity modification.  This injection will be diagnostic and hopefully therapeutic.  Please see requesting physician notes for further details and justification.  Exam has shown concordant pain with facet joint loading.   ROS Otherwise per HPI.  Assessment & Plan: Visit Diagnoses:    ICD-10-CM   1. Spondylosis without myelopathy or radiculopathy, lumbar region  M47.816 XR C-ARM NO REPORT    Facet Injection    methylPREDNISolone acetate (DEPO-MEDROL) injection 80 mg      Plan: No additional findings.   Meds & Orders:  Meds ordered this encounter  Medications   methylPREDNISolone acetate (DEPO-MEDROL) injection 80 mg    Orders Placed This Encounter  Procedures   Facet Injection   XR C-ARM NO REPORT    Follow-up: Return for visit to requesting provider as needed.   Procedures: No procedures performed  Lumbar Facet Joint Intra-Articular Injection(s) with Fluoroscopic Guidance  Patient: Seth Cox      Date of Birth: April 23, 1951 MRN: [id number] PCP: Audley Hose, MD      Visit Date: 06/14/2022   Universal Protocol:    Date/Time: 06/14/2022  Consent Given By: the patient  Position: PRONE   Additional Comments: Vital signs were monitored before and after the procedure. Patient was prepped and draped in the usual sterile fashion. The correct patient, procedure, and site was verified.   Injection Procedure Details:   Procedure Site One Meds Administered:  Meds ordered this encounter  Medications   methylPREDNISolone acetate (DEPO-MEDROL) injection 80 mg     Laterality: Bilateral  Location/Site:  L4-L5 L5-S1  Needle size: 22 guage  Needle type: Spinal  Needle Placement: Articular  Findings:  -Comments: Excellent flow of contrast producing a partial arthrogram.  Procedure Details: The fluoroscope beam is vertically oriented in AP, and the inferior recess is visualized beneath the lower pole of the inferior apophyseal process, which represents the target point for needle insertion. When direct visualization is difficult the target point is located at the medial projection of the vertebral pedicle. The region overlying each aforementioned target is locally anesthetized with a 1 to 2 ml. volume of 1% Lidocaine without Epinephrine.   The spinal needle was inserted into each of the above mentioned facet joints using biplanar fluoroscopic guidance. A 0.25 to 0.5 ml. volume of Isovue-250 was injected and a partial facet joint arthrogram was obtained. A single spot film was obtained of the resulting arthrogram.    One to 1.25 ml of the steroid/anesthetic solution was then injected into each of the facet joints noted above.   Additional Comments:  No complications occurred Dressing: 2 x 2 sterile gauze and Band-Aid    Post-procedure details: Patient was observed during the procedure. Post-procedure instructions were reviewed.  Patient left the clinic in stable condition.    Clinical History: MRI LUMBAR SPINE WITHOUT CONTRAST    TECHNIQUE:  Multiplanar, multisequence MR imaging of the lumbar spine was  performed. No intravenous  contrast was administered.    COMPARISON:  Lumbar radiographs 05/05/2020.    Chest radiographs 1017.    FINDINGS:  Segmentation: 12 full size ribs in 2007. 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  Correlation  with radiographs is recommended prior to any operative  intervention.    Alignment: Straightening of lumbar lordosis with subtle  anterolisthesis at both L4-L5 and L5-S1.    Vertebrae: No marrow edema or evidence of acute osseous abnormality.  Visualized bone marrow signal is within normal limits. Intact  visible sacrum and SI joints.    Conus medullaris and cauda equina: Conus extends to the T12-L1  level. No lower spinal cord or conus signal abnormality.    Paraspinal and other soft tissues: Negative.    Disc levels:    T12-L1:  Negative.    L1-L2:  Negative.    L2-L3:  Subtle disc bulging.  No stenosis.    L3-L4: Mild circumferential disc bulge eccentric to the left. Mild  facet and moderate ligament flavum hypertrophy. Borderline to mild  spinal stenosis, left L3 foraminal stenosis.    L4-L5: Disc desiccation. Circumferential disc bulge. Moderate facet  and ligament flavum hypertrophy. Mild spinal and bilateral lateral  recess stenosis (L5 nerve levels). Endplate spurring contributes to  moderate bilateral L4 foraminal stenosis.    L5-S1: Disc desiccation, circumferential disc bulge and endplate  spurring. Small left subarticular annular fissure of the disc  (series 3, image 10). Moderate facet hypertrophy. No spinal  stenosis. Mild bilateral lateral recess stenosis (S1 nerve levels).  Mild bilateral L5 foraminal stenosis.    S1-S2: Mostly lumbarized, negative.    IMPRESSION:  1. 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 Correlation with radiographs is  recommended prior to any operative intervention.    2. Subtle anterolisthesis at both L4-L5 and L5-S1 with disc bulging  and moderate posterior element hypertrophy. Mild spinal stenosis at  the former. Mild bilateral lateral recess stenosis at both levels.  Moderate bilateral L4 neural foraminal stenosis.    3. Similar multifactorial borderline to mild spinal  and foraminal  stenosis at L3-L4.      Electronically Signed    By: Genevie Ann M.D.    On: 05/29/2020 07:44     Objective:  VS:  HT:    WT:   BMI:     BP:125/76  HR:(!) 46bpm  TEMP: ( )  RESP:  Physical Exam Vitals and nursing note reviewed.  Constitutional:      General: He is not in acute distress.    Appearance: Normal appearance. He is not ill-appearing.  HENT:     Head: Normocephalic and atraumatic.     Right Ear: External ear normal.     Left Ear: External ear normal.     Nose: No congestion.  Eyes:     Extraocular Movements: Extraocular movements intact.  Cardiovascular:     Rate and Rhythm: Normal rate.     Pulses: Normal pulses.  Pulmonary:     Effort: Pulmonary effort is normal. No respiratory distress.  Abdominal:     General: There is no distension.     Palpations: Abdomen is soft.  Musculoskeletal:        General: No tenderness or signs of injury.     Cervical back: Neck supple.     Right lower leg: No edema.     Left lower leg: No edema.  Comments: Patient has good distal strength without clonus.  Skin:    Findings: No erythema or rash.  Neurological:     General: No focal deficit present.     Mental Status: He is alert and oriented to person, place, and time.     Sensory: No sensory deficit.     Motor: No weakness or abnormal muscle tone.     Coordination: Coordination normal.  Psychiatric:        Mood and Affect: Mood normal.        Behavior: Behavior normal.      Imaging: No results found.

## 2022-06-16 NOTE — Procedures (Signed)
Lumbar Facet Joint Intra-Articular Injection(s) with Fluoroscopic Guidance  Patient: Seth Cox      Date of Birth: 07/19/1950 MRN: [id number] PCP: Audley Hose, MD      Visit Date: 06/14/2022   Universal Protocol:    Date/Time: 06/14/2022  Consent Given By: the patient  Position: PRONE   Additional Comments: Vital signs were monitored before and after the procedure. Patient was prepped and draped in the usual sterile fashion. The correct patient, procedure, and site was verified.   Injection Procedure Details:  Procedure Site One Meds Administered:  Meds ordered this encounter  Medications   methylPREDNISolone acetate (DEPO-MEDROL) injection 80 mg     Laterality: Bilateral  Location/Site:  L4-L5 L5-S1  Needle size: 22 guage  Needle type: Spinal  Needle Placement: Articular  Findings:  -Comments: Excellent flow of contrast producing a partial arthrogram.  Procedure Details: The fluoroscope beam is vertically oriented in AP, and the inferior recess is visualized beneath the lower pole of the inferior apophyseal process, which represents the target point for needle insertion. When direct visualization is difficult the target point is located at the medial projection of the vertebral pedicle. The region overlying each aforementioned target is locally anesthetized with a 1 to 2 ml. volume of 1% Lidocaine without Epinephrine.   The spinal needle was inserted into each of the above mentioned facet joints using biplanar fluoroscopic guidance. A 0.25 to 0.5 ml. volume of Isovue-250 was injected and a partial facet joint arthrogram was obtained. A single spot film was obtained of the resulting arthrogram.    One to 1.25 ml of the steroid/anesthetic solution was then injected into each of the facet joints noted above.   Additional Comments:  No complications occurred Dressing: 2 x 2 sterile gauze and Band-Aid    Post-procedure details: Patient was observed  during the procedure. Post-procedure instructions were reviewed.  Patient left the clinic in stable condition.

## 2023-05-26 ENCOUNTER — Other Ambulatory Visit: Payer: Self-pay | Admitting: Nurse Practitioner

## 2023-05-26 ENCOUNTER — Ambulatory Visit
Admission: RE | Admit: 2023-05-26 | Discharge: 2023-05-26 | Disposition: A | Discharge status: Home / Self Care | Payer: Medicare HMO | Source: Ambulatory Visit | Attending: Nurse Practitioner | Admitting: Nurse Practitioner

## 2023-05-26 DIAGNOSIS — K439 Ventral hernia without obstruction or gangrene: Secondary | ICD-10-CM

## 2024-05-12 ENCOUNTER — Emergency Department (HOSPITAL_COMMUNITY)
Admission: EM | Admit: 2024-05-12 | Discharge: 2024-05-12 | Disposition: A | Discharge status: Home / Self Care | Attending: Emergency Medicine | Admitting: Emergency Medicine

## 2024-05-12 ENCOUNTER — Telehealth (HOSPITAL_COMMUNITY): Payer: Self-pay | Admitting: Emergency Medicine

## 2024-05-12 ENCOUNTER — Emergency Department (HOSPITAL_COMMUNITY)

## 2024-05-12 DIAGNOSIS — W06XXXA Fall from bed, initial encounter: Secondary | ICD-10-CM | POA: Diagnosis not present

## 2024-05-12 DIAGNOSIS — D72829 Elevated white blood cell count, unspecified: Secondary | ICD-10-CM | POA: Insufficient documentation

## 2024-05-12 DIAGNOSIS — D696 Thrombocytopenia, unspecified: Secondary | ICD-10-CM | POA: Diagnosis not present

## 2024-05-12 DIAGNOSIS — R0789 Other chest pain: Secondary | ICD-10-CM | POA: Diagnosis not present

## 2024-05-12 DIAGNOSIS — R001 Bradycardia, unspecified: Secondary | ICD-10-CM | POA: Diagnosis not present

## 2024-05-12 DIAGNOSIS — R059 Cough, unspecified: Secondary | ICD-10-CM | POA: Insufficient documentation

## 2024-05-12 DIAGNOSIS — S0990XA Unspecified injury of head, initial encounter: Secondary | ICD-10-CM | POA: Insufficient documentation

## 2024-05-12 DIAGNOSIS — W19XXXA Unspecified fall, initial encounter: Secondary | ICD-10-CM

## 2024-05-12 LAB — URINALYSIS, W/ REFLEX TO CULTURE (INFECTION SUSPECTED)
Bacteria, UA: NONE SEEN
Bilirubin Urine: NEGATIVE
Glucose, UA: NEGATIVE mg/dL
Hgb urine dipstick: NEGATIVE
Ketones, ur: NEGATIVE mg/dL
Leukocytes,Ua: NEGATIVE
Nitrite: NEGATIVE
Protein, ur: NEGATIVE mg/dL
Specific Gravity, Urine: 1.009 (ref 1.005–1.030)
pH: 6 (ref 5.0–8.0)

## 2024-05-12 LAB — CBC WITH DIFFERENTIAL/PLATELET
Abs Immature Granulocytes: 0.01 K/uL (ref 0.00–0.07)
Basophils Absolute: 0 K/uL (ref 0.0–0.1)
Basophils Relative: 0 %
Eosinophils Absolute: 0.1 K/uL (ref 0.0–0.5)
Eosinophils Relative: 3 %
HCT: 43.1 % (ref 39.0–52.0)
Hemoglobin: 14.3 g/dL (ref 13.0–17.0)
Immature Granulocytes: 0 %
Lymphocytes Relative: 31 %
Lymphs Abs: 1.3 K/uL (ref 0.7–4.0)
MCH: 31.8 pg (ref 26.0–34.0)
MCHC: 33.2 g/dL (ref 30.0–36.0)
MCV: 96 fL (ref 80.0–100.0)
Monocytes Absolute: 0.5 K/uL (ref 0.1–1.0)
Monocytes Relative: 11 %
Neutro Abs: 2.3 K/uL (ref 1.7–7.7)
Neutrophils Relative %: 55 %
Platelets: 115 K/uL — ABNORMAL LOW (ref 150–400)
RBC: 4.49 MIL/uL (ref 4.22–5.81)
RDW: 12.3 % (ref 11.5–15.5)
WBC: 4.2 K/uL (ref 4.0–10.5)
nRBC: 0 % (ref 0.0–0.2)

## 2024-05-12 LAB — RESP PANEL BY RT-PCR (RSV, FLU A&B, COVID)  RVPGX2
Influenza A by PCR: NEGATIVE
Influenza B by PCR: NEGATIVE
Resp Syncytial Virus by PCR: NEGATIVE
SARS Coronavirus 2 by RT PCR: NEGATIVE

## 2024-05-12 LAB — BASIC METABOLIC PANEL WITH GFR
Anion gap: 9 (ref 5–15)
BUN: 15 mg/dL (ref 8–23)
CO2: 26 mmol/L (ref 22–32)
Calcium: 9.2 mg/dL (ref 8.9–10.3)
Chloride: 107 mmol/L (ref 98–111)
Creatinine, Ser: 1.15 mg/dL (ref 0.61–1.24)
GFR, Estimated: >60 mL/min
Glucose, Bld: 104 mg/dL — ABNORMAL HIGH (ref 70–99)
Potassium: 4.5 mmol/L (ref 3.5–5.1)
Sodium: 142 mmol/L (ref 135–145)

## 2024-05-12 LAB — TROPONIN T, HIGH SENSITIVITY: Troponin T High Sensitivity: <15 ng/L (ref 0–19)

## 2024-05-12 MED ORDER — TIZANIDINE HCL 4 MG PO TABS
2.0000 mg | ORAL_TABLET | Freq: Once | ORAL | Status: AC
  Administered 2024-05-12: 2 mg via ORAL
  Filled 2024-05-12: qty 1

## 2024-05-12 MED ORDER — ACETAMINOPHEN 500 MG PO TABS
1000.0000 mg | ORAL_TABLET | Freq: Once | ORAL | Status: AC
  Administered 2024-05-12: 1000 mg via ORAL
  Filled 2024-05-12: qty 2

## 2024-05-12 MED ORDER — LIDOCAINE 5 % EX PTCH
1.0000 | MEDICATED_PATCH | Freq: Once | CUTANEOUS | Status: DC
  Administered 2024-05-12: 1 via TRANSDERMAL
  Filled 2024-05-12: qty 1

## 2024-05-12 MED ORDER — TIZANIDINE HCL 4 MG PO TABS: 4.0000 mg | ORAL_TABLET | Freq: Four times a day (QID) | ORAL | 0 refills | Status: AC | PRN

## 2024-05-12 NOTE — Telephone Encounter (Signed)
 Orders only encounter for neurosurgery mistakenly not placed at time of patient discharge from the ED.

## 2024-05-12 NOTE — ED Triage Notes (Signed)
 Patient c/o fall 1 week ago  Hitting left side of head above eye Denies LOC, thinners, denies vision impairment Says pain is on/off rated 5

## 2024-05-12 NOTE — ED Provider Notes (Signed)
 " [facility] AT [facility] Provider Note   CSN: [id number] Arrival date & time: 05/12/24  9273     History Chief Complaint  Patient presents with   Fall    HPI: Seth Cox is a 73 y.o. male with story pertinent for sinus bradycardia, BPH, elevated blood pressure, prediabetes, thrombocytopenia who presents complaining of fall. Patient arrived via POV accompanied by wife, Seth Cox.  History provided by patient and spouse/partner.  No interpreter required during this encounter.  Patient reportedly had a trip and fall while getting out of bed 4 days ago, 12/23.  Reports that he fell, no associated loss of consciousness, no DOAC use.  He ate did not immediately go to the doctors for evaluation.  Reports that since the fall he has had intermittent headache, as well as some left-sided chest wall pain when he coughs.  Reports that the cough is productive of clear sputum, began approximately 24 hours after the fall.  Denies fever, chills, shortness of breath.  Patient's recorded medical, surgical, social, medication list and allergies were reviewed in the Snapshot window as part of the initial history.   Prior to Admission medications  Medication Sig Start Date End Date Taking? Authorizing Provider  tiZANidine  (ZANAFLEX ) 4 MG tablet Take 1 tablet (4 mg total) by mouth every 6 (six) hours as needed for muscle spasms. 05/12/24  Yes Seth Jerilynn RAMAN, MD  celecoxib (CELEBREX) 200 MG capsule Take 1 capsule by mouth daily. 06/09/17   [provider]  clotrimazole -betamethasone  (LOTRISONE ) cream Apply 1 application topically 2 (two) times daily. 12/22/18   Gershon Donnice SAUNDERS, DPM  oxybutynin (DITROPAN-XL) 10 MG 24 hr tablet Take 10 mg by mouth daily. 05/18/17   [provider]  tamsulosin (FLOMAX) 0.4 MG CAPS capsule Take 1 capsule by mouth at bedtime. 06/05/17   [provider]  loratadine (CLARITIN) 10 MG tablet loratadine 10 mg tablet  Take  1 tablet every day by oral route for 90 days.  08/30/19  [provider]     Allergies: Patient has no known allergies.   Review of Systems   ROS as per HPI  Physical Exam Updated Vital Signs BP (!) 148/93 (BP Location: Left Arm)   Pulse (!) 56   Temp 97.9 F (36.6 C) (Oral)   Resp 16   Ht 6' (1.829 m)   Wt 88.5 kg   SpO2 98%   BMI 26.45 kg/m  Physical Exam Vitals and nursing note reviewed.  Constitutional:      General: He is not in acute distress.    Appearance: He is well-developed.  HENT:     Head: Normocephalic and atraumatic.  Eyes:     Conjunctiva/sclera: Conjunctivae normal.  Cardiovascular:     Rate and Rhythm: Regular rhythm. Bradycardia present.     Heart sounds: No murmur heard. Pulmonary:     Effort: Pulmonary effort is normal. No respiratory distress.     Breath sounds: Normal breath sounds.  Chest:     Chest wall: Tenderness (Left-sided chest wall without instability) present.  Abdominal:     Palpations: Abdomen is soft.     Tenderness: There is no abdominal tenderness.  Musculoskeletal:        General: No swelling.     Cervical back: Neck supple. No bony tenderness.     Thoracic back: No bony tenderness.     Lumbar back: No bony tenderness.  Skin:    General: Skin is warm and dry.  Capillary Refill: Capillary refill takes less than 2 seconds.  Neurological:     Mental Status: He is alert.     Gait: Gait normal.  Psychiatric:        Mood and Affect: Mood normal.     ED Course/ Medical Decision Making/ A&P    Procedures Procedures   Medications Ordered in ED Medications  lidocaine  (LIDODERM ) 5 % 1 patch (1 patch Transdermal Patch Applied 05/12/24 0826)  acetaminophen  (TYLENOL ) tablet 1,000 mg (1,000 mg Oral Given 05/12/24 0827)  tiZANidine  (ZANAFLEX ) tablet 2 mg (2 mg Oral Given 05/12/24 0827)    Medical Decision Making:   Seth Cox is a 73 y.o. male who presents for recent fall as per above.  Physical exam is  pertinent for left-sided chest wall tenderness to palpation.   The differential includes but is not limited to pneumonia, viral process, pulmonary contusion,, ICH, TBI, skull fracture, spinal fracture/dislocation, blunt thoracic trauma, hemothorax, pneumothorax, rib fractures, blunt abdominal trauma, hemorrhage, extremity fracture, dislocation.  Independent historian: Spouse/partner  External data reviewed: Labs: reviewed prior labs for baseline  Initial Plan:  Screening CT head and CT C-spine given age, fall Screening labs including CBC and Metabolic panel to evaluate for infectious or metabolic etiology of disease.  COVID/flu/RSV given cough CXR to evaluate for structural/infectious intrathoracic pathology Urinalysis with reflex culture ordered to evaluate for UTI or relevant urologic/nephrologic pathology. .  EKG to evaluate for cardiac pathology Objective evaluation as below reviewed   Labs: Ordered and Independent interpretation CBC: Mild leukocytosis, anemia.  Mild nonspecific thrombocytopenia.  No recent priors available for comparison, however patient does have a documented PMH of thrombocytopenia. BMP: No AKI or emergent electrolyte derangement Troponin: Undetectable COVID/flu/RSV: Negative UA: Without UTI  Radiology: Ordered, Independent interpretation, and All images reviewed independently.  Agree with radiology report at this time.   CT head: No ICH or displaced skull fracture CT C-spine: No displaced fracture or dislocation, patient does have a lytic lesion of the C2, as well as severe degenerative changes at C5-C7 CXR: No acute cardiac or pulmonary abnormality. No appreciable rib fx. No PTX.  DG Chest 2 View Result Date: 05/12/2024 EXAM: 2 VIEW(S) XRAY OF THE CHEST 05/12/2024 08:56:20 AM COMPARISON: CT chest 02/18/2022 and earlier. CLINICAL HISTORY: 73 year old male with cough and fall 1 week ago with continued pain. FINDINGS: LUNGS AND PLEURA: Upper lobe predominant  centrilobular emphysema demonstrated on prior CT. No focal pulmonary opacity. No pleural effusion. No pneumothorax. HEART AND MEDIASTINUM: Stable mild tortuosity of the thoracic aorta and other mediastinal contours are normal. BONES AND SOFT TISSUES: No acute osseous abnormality. IMPRESSION: 1. No acute cardiopulmonary abnormality. Electronically signed by: Seth Hurst MD 05/12/2024 09:47 AM EST RP Workstation: [id number]   CT Cervical Spine Wo Contrast Result Date: 05/12/2024 EXAM: CT CERVICAL SPINE WITHOUT CONTRAST 05/12/2024 09:14:51 AM TECHNIQUE: CT of the cervical spine was performed without the administration of intravenous contrast. Multiplanar reformatted images are provided for review. Automated exposure control, iterative reconstruction, and/or weight based adjustment of the mA/kV was utilized to reduce the radiation dose to as low as reasonably achievable. COMPARISON: CT head reported separately today. CLINICAL HISTORY: 72 year old male. Neck trauma (Age >= 65y). Fall 1 week ago with continued pain. FINDINGS: BONES AND ALIGNMENT: No acute fracture or traumatic malalignment. Appearance of chronic degenerative versus congenital ankylosis of the cervical levels C2 through C4. DEGENERATIVE CHANGES: Chronic very severe degeneration at the anterior C1 odontoid articulation including pronounced vacuum disc, bulky partially calcified ligament hypertrophy about  the odontoid, and a large underlying odontoid subchondral cyst. 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 Chronic severe lower cervical disc and endplate degeneration also with vacuum disc and bulky endplates bearing R5-R4 through C6-C7. Subsequent spinal stenosis appears maximal at C5-C6 and mild to moderate there. Advanced chronic C7-T1 facet arthropathy on the left with subchondral cysts. SOFT TISSUES: No prevertebral soft tissue swelling. Non-contrast neck soft tissues. Visible thoracic inlet with  evidence of centrilobular emphysema. IMPRESSION: 1. No acute traumatic injury identified in the cervical spine. 2. 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 Maximal spinal stenosis suspected at C5-C6. Electronically signed by: Seth Hurst MD 05/12/2024 09:46 AM EST RP Workstation: [id number]   CT Head Wo Contrast Result Date: 05/12/2024 EXAM: CT HEAD WITHOUT CONTRAST 05/12/2024 09:14:51 AM TECHNIQUE: CT of the head was performed without the administration of intravenous contrast. Automated exposure control, iterative reconstruction, and/or weight based adjustment of the mA/kV was utilized to reduce the radiation dose to as low as reasonably achievable. COMPARISON: CT head 10/21/2008. CLINICAL HISTORY: 73 year old male. Fall 1 week ago with continued pain. Minor head trauma. FINDINGS: BRAIN AND VENTRICLES: Brain volume has decreased since 2010 but appears within normal limits for age. No acute hemorrhage. No evidence of acute infarct. No hydrocephalus. No extra-axial collection. No mass effect or midline shift. Patchy mild to moderate periventricular white matter hypodensity most pronounced in the parietal lobes. Gray white differentiation otherwise preserved. No suspicious intracranial vascular hyperdensity. ORBITS: No acute abnormality. SINUSES: Paranasal sinuses, tympanic cavities and mastoids are clear. SOFT TISSUES AND SKULL: No acute soft tissue abnormality. No skull fracture. Mild for age calcified atherosclerosis at the skull base. IMPRESSION: 1. No recent traumatic injury identified. 2. No acute intracranial abnormality. Mild to moderate for age white matter changes, most commonly due to small vessel disease. Electronically signed by: Seth Hurst MD 05/12/2024 09:42 AM EST RP Workstation: [id number]    EKG/Medicine tests: Ordered and Independent interpretation EKG Interpretation: Sinus rhythm Consider left atrial enlargement Left axis  deviation Borderline T abnormalities, inferior leads Confirmed by Seth Cox ([id number]) on 05/12/2024 9:43:59 AM                Interventions: Tylenol , tizanidine , lidocaine  patch  See the EMR for full details regarding lab and imaging results.  Currently, patient is awake, alert, and protecting own airway and is hemodynamically stable.  Will follow with several days ago, do feel that patient requires CT of his head and C-spine given his age.  Given patient has no pelvic pain, is ambulatory without difficulty, do not feel that patient warrants pelvic x-ray, as feel that it would be of low yield.  Do feel that patient warrants chest x-ray given he has left chest wall pain and tenderness to palpation, additionally to further evaluate the etiology of his cough.  Additionally given patient's cough, do feel that patient warrants screening labs as per initial plan above.  Will treat patient's chest wall pain with multimodal pain therapy.  Labs are reassuring, chest x-ray does not demonstrate pneumonia nor evidence of trauma to the chest wall.  Patient with improvement after multimodal pain therapy.  Doubt sepsis from pneumonia given reassuring labs.  CT head and CT C-spine did not reveal traumatic abnormalities, do reveal significant degenerative changes to the C-spine, therefore will refer to neurosurgery.  On reevaluation, patient is well-appearing, patient and loved one are comfortable with plan for discharge and outpatient follow-up, discharged  with plan for continuation of multimodal pain therapy, prescribed short course of tizanidine .  Presentation is most consistent with acute complicated illness and I did consider and rule out acute life/limb-threatening illness  Discussion of management or test interpretations with external provider(s): Not indicated  Risk Drugs:OTC drugs and Prescription drug management  Disposition: DISCHARGE: I believe that the patient is safe for discharge home with  outpatient follow-up. Patient was informed of all pertinent physical exam, laboratory, and imaging findings. Patient's suspected etiology of their symptom presentation was discussed with the patient and all questions were answered. We discussed following up with PCP and neurosurgery. I provided thorough ED return precautions. The patient feels safe and comfortable with this plan.  MDM generated using voice dictation software and may contain dictation errors.  Please contact me for any clarification or with any questions.   Clinical Impression:  1. Fall, initial encounter      Discharge   Final Clinical Impression(s) / ED Diagnoses Final diagnoses:  Fall, initial encounter    Rx / DC Orders ED Discharge Orders          Ordered    tiZANidine  (ZANAFLEX ) 4 MG tablet  Every 6 hours PRN        05/12/24 1120             Seth Jerilynn RAMAN, MD 05/12/24 1601  "

## 2024-05-12 NOTE — Discharge Instructions (Signed)
 Seth Cox  Thank you for allowing us  to take care of you today.  You came to the Emergency Department today because of fall several days ago while sleepwalking.  Here in the emergency department we got a CT of your head and neck, we are not seeing any injuries from the fall, though we do see significant degenerative changes (similar to arthritis) in your neck, therefore we are going to refer you to a spine surgeon to establish care should you ever have neck issues in the future.  Additionally we got a chest x-ray to look for pneumonia or rib fractures given your pain.  We are not seeing any evidence of this.  Your pain is likely secondary to muscular strain from coughing.  We recommend Tylenol , ibuprofen, and following up with your primary care doctor.  Will also prescribe you a muscle relaxer which you can take to ease the pain.  Please do not drive while you are on this medication.  To-Do: 1. Please follow-up with your primary doctor within 1 - 2 weeks / as soon as possible.   Please return to the Emergency Department or call 911 if you experience have worsening of your symptoms, or do not get better, new or different chest pain, shortness of breath, severe or significantly worsening pain, high fever, severe confusion, pass out or have any reason to think that you need emergency medical care.   We hope you feel better soon.   Mitzie Later, MD Department of Emergency Medicine [facility] [place]

## 2024-05-23 NOTE — Telephone Encounter (Signed)
"  error  "

## 2024-06-12 NOTE — Progress Notes (Unsigned)
 "  Referring Physician:  Rogelia Jerilynn RAMAN, MD [address] [place],  [place] [address]  Primary Physician:  Seth Ezekiel NOVAK, MD  History of Present Illness: 06/12/2024 [name] is here today with a chief complaint of ***  Neck pain after fall Any numbness, tingling, and weakness.   Duration: *** Location: *** Quality: *** Severity: ***  Precipitating: aggravated by *** Modifying factors: made better by *** Weakness: none Timing: *** Bowel/Bladder Dysfunction: none  Conservative measures:  Physical therapy: *** Has not participated in? Multimodal medical therapy including regular antiinflammatories: *** celecoxib, tizanidine  Injections: *** epidural steroid injections?   Past Surgery: ***  Seth Cox has ***no symptoms of cervical myelopathy.  The symptoms are causing a significant impact on the patient's life.   Review of Systems:  A 10 point review of systems is negative, except for the pertinent positives and negatives detailed in the HPI.  Past Medical History: Past Medical History:  Diagnosis Date   Elevated heart rate with elevated blood pressure without diagnosis of hypertension    History of BPH     Past Surgical History: Past Surgical History:  Procedure Laterality Date   CHONDROPLASTY Right 10/14/2020   Procedure: CHONDROPLASTY;  Surgeon: Seth Jerona GAILS, MD;  Location: [facility];  Service: Orthopedics;  Laterality: Right;   KNEE ARTHROSCOPY WITH LATERAL MENISECTOMY Right 10/14/2020   Procedure: RIGHT KNEE ARTHROSCOPY WITH PARTALATERAL MENISECTOMY;  Surgeon: Seth Jerona GAILS, MD;  Location: [facility];  Service: Orthopedics;  Laterality: Right;   MANDIBLE FRACTURE SURGERY      Allergies: Allergies as of 06/14/2024   (No Known Allergies)    Medications: Outpatient Encounter Medications as of 06/14/2024  Medication Sig   celecoxib (CELEBREX) 200 MG capsule Take 1 capsule by mouth daily.    clotrimazole -betamethasone  (LOTRISONE ) cream Apply 1 application topically 2 (two) times daily.   oxybutynin (DITROPAN-XL) 10 MG 24 hr tablet Take 10 mg by mouth daily.   tamsulosin (FLOMAX) 0.4 MG CAPS capsule Take 1 capsule by mouth at bedtime.   tiZANidine  (ZANAFLEX ) 4 MG tablet Take 1 tablet (4 mg total) by mouth every 6 (six) hours as needed for muscle spasms.   [DISCONTINUED] loratadine (CLARITIN) 10 MG tablet loratadine 10 mg tablet  Take 1 tablet every day by oral route for 90 days.   No facility-administered encounter medications on file as of 06/14/2024.    Social History: Social History[1]  Family Medical History: Family History  Problem Relation Age of Onset   Fibromyalgia Mother    Healthy Sister    Hypertension Brother    Heart failure Sister        Died at age 73.  Had some type of cardiomyopathy and was pending transplant    Physical Examination: @VITALWITHPAIN @  General: Patient is well developed, well nourished, calm, collected, and in no apparent distress. Attention to examination is appropriate.  Psychiatric: Patient is non-anxious.  Head:  Pupils equal, round, and reactive to light.  ENT:  Oral mucosa appears well hydrated.  Neck:   Supple.  ***Full range of motion.  Respiratory: Patient is breathing without any difficulty.  Extremities: No edema.  Vascular: Palpable dorsal pedal pulses.  Skin:   On exposed skin, there are no abnormal skin lesions.  NEUROLOGICAL:     Awake, alert, oriented to person, place, and time.  Speech is clear and fluent. Fund of knowledge is appropriate.   Cranial Nerves: Pupils equal round and reactive to light.  Facial tone is symmetric.  Facial sensation is symmetric.  ROM of spine: ***full.  Palpation of spine: ***non tender.    Strength: Side Biceps Triceps Deltoid Interossei Grip Wrist Ext. Wrist Flex.  R 5 5 5 5 5 5 5   L 5 5 5 5 5 5 5    Side Iliopsoas Quads Hamstring PF DF EHL  R 5 5 5 5 5 5   L 5 5 5 5 5 5     Reflexes are ***2+ and symmetric at the biceps, triceps, brachioradialis, patella and achilles.   Hoffman's is absent.  Clonus is not present.  Toes are down-going.  Bilateral upper and lower extremity sensation is intact to light touch.    Gait is normal.   No difficulty with tandem gait.   No evidence of dysmetria noted.  Medical Decision Making  Imaging: ***  I have personally reviewed the images and agree with the above interpretation.  Assessment and Plan: [name] is a pleasant 74 y.o. male with ***    Thank you for involving me in the care of this patient.   I spent a total of *** minutes in both face-to-face and non-face-to-face activities for this visit on the date of this encounter.   Seth Decamp, PA-C Dept. of Neurosurgery     [1]  Social History Tobacco Use   Smoking status: Former    Current packs/day: 1.00    Types: Cigarettes   Smokeless tobacco: Never  Substance Use Topics   Alcohol use: Yes    Comment: occasional   Drug use: No   "

## 2024-06-14 ENCOUNTER — Ambulatory Visit: Admitting: Physician Assistant

## 2024-06-20 ENCOUNTER — Telehealth: Payer: Self-pay | Admitting: Physician Assistant

## 2024-06-20 NOTE — Telephone Encounter (Signed)
 LVM for pt to call back and reschedule his appointment w/ [facility].  Per Edsel needs to be sooner rather than later.
